# Patient Record
Sex: Female | Born: 1985
Health system: Southern US, Community
[De-identification: ages and names within clinical notes are randomized; demographics above are authoritative.]

## PROBLEM LIST (undated history)

## (undated) DIAGNOSIS — R112 Nausea with vomiting, unspecified: Secondary | ICD-10-CM

## (undated) DIAGNOSIS — Z9889 Other specified postprocedural states: Secondary | ICD-10-CM

## (undated) DIAGNOSIS — C4491 Basal cell carcinoma of skin, unspecified: Secondary | ICD-10-CM

## (undated) DIAGNOSIS — T7840XA Allergy, unspecified, initial encounter: Secondary | ICD-10-CM

## (undated) DIAGNOSIS — E079 Disorder of thyroid, unspecified: Secondary | ICD-10-CM

## (undated) HISTORY — DX: Disorder of thyroid, unspecified: E07.9

## (undated) HISTORY — DX: Allergy, unspecified, initial encounter: T78.40XA

---

## 2013-03-31 DIAGNOSIS — C439 Malignant melanoma of skin, unspecified: Secondary | ICD-10-CM

## 2013-03-31 HISTORY — DX: Malignant melanoma of skin, unspecified: C43.9

## 2013-03-31 HISTORY — PX: THYROIDECTOMY, PARTIAL: SHX18

## 2018-06-21 DIAGNOSIS — J309 Allergic rhinitis, unspecified: Secondary | ICD-10-CM | POA: Diagnosis not present

## 2018-07-12 DIAGNOSIS — J309 Allergic rhinitis, unspecified: Secondary | ICD-10-CM | POA: Diagnosis not present

## 2018-08-03 DIAGNOSIS — J309 Allergic rhinitis, unspecified: Secondary | ICD-10-CM | POA: Diagnosis not present

## 2018-08-24 DIAGNOSIS — J3081 Allergic rhinitis due to animal (cat) (dog) hair and dander: Secondary | ICD-10-CM | POA: Diagnosis not present

## 2018-08-24 DIAGNOSIS — H1045 Other chronic allergic conjunctivitis: Secondary | ICD-10-CM | POA: Diagnosis not present

## 2018-08-24 DIAGNOSIS — J301 Allergic rhinitis due to pollen: Secondary | ICD-10-CM | POA: Diagnosis not present

## 2018-08-24 DIAGNOSIS — J3089 Other allergic rhinitis: Secondary | ICD-10-CM | POA: Diagnosis not present

## 2018-08-27 DIAGNOSIS — J309 Allergic rhinitis, unspecified: Secondary | ICD-10-CM | POA: Diagnosis not present

## 2018-09-02 DIAGNOSIS — S61209A Unspecified open wound of unspecified finger without damage to nail, initial encounter: Secondary | ICD-10-CM | POA: Diagnosis not present

## 2018-09-06 DIAGNOSIS — S61219D Laceration without foreign body of unspecified finger without damage to nail, subsequent encounter: Secondary | ICD-10-CM | POA: Diagnosis not present

## 2018-09-06 DIAGNOSIS — T148XXA Other injury of unspecified body region, initial encounter: Secondary | ICD-10-CM | POA: Diagnosis not present

## 2018-09-06 DIAGNOSIS — L089 Local infection of the skin and subcutaneous tissue, unspecified: Secondary | ICD-10-CM | POA: Diagnosis not present

## 2018-09-06 DIAGNOSIS — Z09 Encounter for follow-up examination after completed treatment for conditions other than malignant neoplasm: Secondary | ICD-10-CM | POA: Diagnosis not present

## 2018-09-08 DIAGNOSIS — J301 Allergic rhinitis due to pollen: Secondary | ICD-10-CM | POA: Diagnosis not present

## 2018-09-08 DIAGNOSIS — J3089 Other allergic rhinitis: Secondary | ICD-10-CM | POA: Diagnosis not present

## 2018-09-08 DIAGNOSIS — J3081 Allergic rhinitis due to animal (cat) (dog) hair and dander: Secondary | ICD-10-CM | POA: Diagnosis not present

## 2018-09-22 DIAGNOSIS — J3089 Other allergic rhinitis: Secondary | ICD-10-CM | POA: Diagnosis not present

## 2018-09-22 DIAGNOSIS — J3081 Allergic rhinitis due to animal (cat) (dog) hair and dander: Secondary | ICD-10-CM | POA: Diagnosis not present

## 2018-09-22 DIAGNOSIS — J301 Allergic rhinitis due to pollen: Secondary | ICD-10-CM | POA: Diagnosis not present

## 2018-09-29 DIAGNOSIS — J3089 Other allergic rhinitis: Secondary | ICD-10-CM | POA: Diagnosis not present

## 2018-09-29 DIAGNOSIS — J301 Allergic rhinitis due to pollen: Secondary | ICD-10-CM | POA: Diagnosis not present

## 2018-09-29 DIAGNOSIS — J3081 Allergic rhinitis due to animal (cat) (dog) hair and dander: Secondary | ICD-10-CM | POA: Diagnosis not present

## 2018-10-06 DIAGNOSIS — J3089 Other allergic rhinitis: Secondary | ICD-10-CM | POA: Diagnosis not present

## 2018-10-06 DIAGNOSIS — J301 Allergic rhinitis due to pollen: Secondary | ICD-10-CM | POA: Diagnosis not present

## 2018-10-06 DIAGNOSIS — J3081 Allergic rhinitis due to animal (cat) (dog) hair and dander: Secondary | ICD-10-CM | POA: Diagnosis not present

## 2018-10-07 DIAGNOSIS — Z01419 Encounter for gynecological examination (general) (routine) without abnormal findings: Secondary | ICD-10-CM | POA: Diagnosis not present

## 2018-10-14 DIAGNOSIS — E559 Vitamin D deficiency, unspecified: Secondary | ICD-10-CM | POA: Diagnosis not present

## 2018-10-20 DIAGNOSIS — J3081 Allergic rhinitis due to animal (cat) (dog) hair and dander: Secondary | ICD-10-CM | POA: Diagnosis not present

## 2018-10-20 DIAGNOSIS — J301 Allergic rhinitis due to pollen: Secondary | ICD-10-CM | POA: Diagnosis not present

## 2018-11-03 DIAGNOSIS — J301 Allergic rhinitis due to pollen: Secondary | ICD-10-CM | POA: Diagnosis not present

## 2018-11-03 DIAGNOSIS — J3081 Allergic rhinitis due to animal (cat) (dog) hair and dander: Secondary | ICD-10-CM | POA: Diagnosis not present

## 2018-11-12 DIAGNOSIS — Z20828 Contact with and (suspected) exposure to other viral communicable diseases: Secondary | ICD-10-CM | POA: Diagnosis not present

## 2018-11-17 DIAGNOSIS — J3081 Allergic rhinitis due to animal (cat) (dog) hair and dander: Secondary | ICD-10-CM | POA: Diagnosis not present

## 2018-11-17 DIAGNOSIS — J301 Allergic rhinitis due to pollen: Secondary | ICD-10-CM | POA: Diagnosis not present

## 2018-12-01 DIAGNOSIS — J301 Allergic rhinitis due to pollen: Secondary | ICD-10-CM | POA: Diagnosis not present

## 2018-12-01 DIAGNOSIS — J3081 Allergic rhinitis due to animal (cat) (dog) hair and dander: Secondary | ICD-10-CM | POA: Diagnosis not present

## 2018-12-10 ENCOUNTER — Other Ambulatory Visit: Payer: Self-pay

## 2018-12-10 ENCOUNTER — Ambulatory Visit: Payer: BC Managed Care – PPO | Admitting: Podiatry

## 2018-12-10 VITALS — Temp 98.1°F

## 2018-12-10 DIAGNOSIS — G8929 Other chronic pain: Secondary | ICD-10-CM

## 2018-12-10 DIAGNOSIS — M79675 Pain in left toe(s): Secondary | ICD-10-CM | POA: Diagnosis not present

## 2018-12-10 DIAGNOSIS — M7752 Other enthesopathy of left foot: Secondary | ICD-10-CM

## 2018-12-10 DIAGNOSIS — Q828 Other specified congenital malformations of skin: Secondary | ICD-10-CM

## 2018-12-16 DIAGNOSIS — J301 Allergic rhinitis due to pollen: Secondary | ICD-10-CM | POA: Diagnosis not present

## 2018-12-16 DIAGNOSIS — J3081 Allergic rhinitis due to animal (cat) (dog) hair and dander: Secondary | ICD-10-CM | POA: Diagnosis not present

## 2018-12-29 DIAGNOSIS — J3089 Other allergic rhinitis: Secondary | ICD-10-CM | POA: Diagnosis not present

## 2018-12-29 DIAGNOSIS — J301 Allergic rhinitis due to pollen: Secondary | ICD-10-CM | POA: Diagnosis not present

## 2018-12-29 DIAGNOSIS — J3081 Allergic rhinitis due to animal (cat) (dog) hair and dander: Secondary | ICD-10-CM | POA: Diagnosis not present

## 2018-12-29 NOTE — Progress Notes (Signed)
  Subjective:  Patient ID: Heather Peters, female    DOB: Apr 28, 1985,  MRN: [id number]  Chief Complaint  Patient presents with  . Foot Problem    Pt states left plantar sub 5th lesion, 1 year duration, no known injuries, denies any drainage. Pt states the pain is on and off, and "feels like a thorn".    33 y.o. female presents with the above complaint.  History above confirmed with patient   Review of Systems: Negative except as noted in the HPI. Denies N/V/F/Ch.  No past medical history on file.  Current Outpatient Medications:  .  etonogestrel-ethinyl estradiol (NUVARING) 0.12-0.015 MG/24HR vaginal ring, , Disp: , Rfl:  .  INCASSIA 0.35 MG tablet, , Disp: , Rfl:  .  NUVARING 0.12-0.015 MG/24HR vaginal ring, , Disp: , Rfl:   Social History   Tobacco Use  Smoking Status Not on file    Not on File Objective:   Vitals:   12/10/18 0942  Temp: 98.1 F (36.7 C)   There is no height or weight on file to calculate BMI. Constitutional Well developed. Well nourished.  Vascular Dorsalis pedis pulses palpable bilaterally. Posterior tibial pulses palpable bilaterally. Capillary refill normal to all digits.  No cyanosis or clubbing noted. Pedal hair growth normal.  Neurologic Normal speech. Oriented to person, place, and time. Epicritic sensation to light touch grossly present bilaterally.  Dermatologic Nails normal Skin punctate keratosis sub-met 5 left  Orthopedic: Normal joint ROM without pain or crepitus bilaterally. No visible deformities. Pain to palpation about the left fifth MPJ   Radiographs: None Assessment:   1. Chronic toe pain, left foot   2. Capsulitis of metatarsophalangeal (MTP) joint of left foot   3. Porokeratosis    Plan:  Patient was evaluated and treated and all questions answered.  Capsulitis left fifth MPJ -Lesion courtesy debrided with 312 blade without incident -Injection delivered to the left fifth MPJ  Procedure: Joint Injection Location:  Left 5th MP joint Skin Prep: Alcohol. Injectate: 0.5 cc 1% lidocaine plain, 0.5 cc dexamethasone phosphate. Disposition: Patient tolerated procedure well. Injection site dressed with a band-aid.    Return in about 3 weeks (around 12/31/2018) for Capsulitis, Left.

## 2018-12-31 ENCOUNTER — Ambulatory Visit: Payer: BC Managed Care – PPO | Admitting: Podiatry

## 2018-12-31 ENCOUNTER — Other Ambulatory Visit: Payer: Self-pay

## 2018-12-31 DIAGNOSIS — M79675 Pain in left toe(s): Secondary | ICD-10-CM | POA: Diagnosis not present

## 2018-12-31 DIAGNOSIS — G8929 Other chronic pain: Secondary | ICD-10-CM | POA: Diagnosis not present

## 2018-12-31 DIAGNOSIS — M7752 Other enthesopathy of left foot: Secondary | ICD-10-CM

## 2018-12-31 NOTE — Progress Notes (Signed)
  Subjective:  Patient ID: Heather Peters, female    DOB: 11-16-1985,  MRN: [id number]  Chief Complaint  Patient presents with  . Foot Pain    f/u on capsulitis to the left, pt states that she is doing a lot better since the last time she was here, pt states that she is no longer feeling any pain when it comes to the injection    33 y.o. female presents with the above complaint.  History above confirmed with patient.  Review of Systems: Negative except as noted in the HPI. Denies N/V/F/Ch.  No past medical history on file.  Current Outpatient Medications:  .  INCASSIA 0.35 MG tablet, , Disp: , Rfl:  .  etonogestrel-ethinyl estradiol (NUVARING) 0.12-0.015 MG/24HR vaginal ring, , Disp: , Rfl:  .  NUVARING 0.12-0.015 MG/24HR vaginal ring, , Disp: , Rfl:   Social History   Tobacco Use  Smoking Status Not on file    No Known Allergies Objective:   There were no vitals filed for this visit. There is no height or weight on file to calculate BMI. Constitutional Well developed. Well nourished.  Vascular Dorsalis pedis pulses palpable bilaterally. Posterior tibial pulses palpable bilaterally. Capillary refill normal to all digits.  No cyanosis or clubbing noted. Pedal hair growth normal.  Neurologic Normal speech. Oriented to person, place, and time. Epicritic sensation to light touch grossly present bilaterally.  Dermatologic Nails normal Skin punctate keratosis sub-met 5 left  Orthopedic: Normal joint ROM without pain or crepitus bilaterally. No visible deformities. No pain to palpation about the left fifth MPJ   Radiographs: None Assessment:   1. Capsulitis of metatarsophalangeal (MTP) joint of left foot   2. Chronic toe pain, left foot    Plan:  Patient was evaluated and treated and all questions answered.  Capsulitis left fifth MPJ -Improved, no pain. -No injection or callus debridement today. -F/u PRN should pain recur -Discussed use of urea cream for callus.  Revitaderm dispensed.    No follow-ups on file.

## 2019-01-12 DIAGNOSIS — J301 Allergic rhinitis due to pollen: Secondary | ICD-10-CM | POA: Diagnosis not present

## 2019-01-12 DIAGNOSIS — J3081 Allergic rhinitis due to animal (cat) (dog) hair and dander: Secondary | ICD-10-CM | POA: Diagnosis not present

## 2019-01-17 DIAGNOSIS — J301 Allergic rhinitis due to pollen: Secondary | ICD-10-CM | POA: Diagnosis not present

## 2019-01-26 DIAGNOSIS — J301 Allergic rhinitis due to pollen: Secondary | ICD-10-CM | POA: Diagnosis not present

## 2019-01-26 DIAGNOSIS — J3081 Allergic rhinitis due to animal (cat) (dog) hair and dander: Secondary | ICD-10-CM | POA: Diagnosis not present

## 2019-01-26 DIAGNOSIS — J3089 Other allergic rhinitis: Secondary | ICD-10-CM | POA: Diagnosis not present

## 2019-02-08 DIAGNOSIS — J3081 Allergic rhinitis due to animal (cat) (dog) hair and dander: Secondary | ICD-10-CM | POA: Diagnosis not present

## 2019-02-08 DIAGNOSIS — J3089 Other allergic rhinitis: Secondary | ICD-10-CM | POA: Diagnosis not present

## 2019-02-08 DIAGNOSIS — J301 Allergic rhinitis due to pollen: Secondary | ICD-10-CM | POA: Diagnosis not present

## 2019-02-15 DIAGNOSIS — J3089 Other allergic rhinitis: Secondary | ICD-10-CM | POA: Diagnosis not present

## 2019-02-15 DIAGNOSIS — J3081 Allergic rhinitis due to animal (cat) (dog) hair and dander: Secondary | ICD-10-CM | POA: Diagnosis not present

## 2019-02-15 DIAGNOSIS — J301 Allergic rhinitis due to pollen: Secondary | ICD-10-CM | POA: Diagnosis not present

## 2019-03-11 DIAGNOSIS — J3081 Allergic rhinitis due to animal (cat) (dog) hair and dander: Secondary | ICD-10-CM | POA: Diagnosis not present

## 2019-03-11 DIAGNOSIS — H1045 Other chronic allergic conjunctivitis: Secondary | ICD-10-CM | POA: Diagnosis not present

## 2019-03-11 DIAGNOSIS — J301 Allergic rhinitis due to pollen: Secondary | ICD-10-CM | POA: Diagnosis not present

## 2019-03-11 DIAGNOSIS — J3089 Other allergic rhinitis: Secondary | ICD-10-CM | POA: Diagnosis not present

## 2019-03-15 DIAGNOSIS — D2271 Melanocytic nevi of right lower limb, including hip: Secondary | ICD-10-CM | POA: Diagnosis not present

## 2019-03-15 DIAGNOSIS — D1801 Hemangioma of skin and subcutaneous tissue: Secondary | ICD-10-CM | POA: Diagnosis not present

## 2019-03-15 DIAGNOSIS — D225 Melanocytic nevi of trunk: Secondary | ICD-10-CM | POA: Diagnosis not present

## 2019-03-15 DIAGNOSIS — Z85820 Personal history of malignant melanoma of skin: Secondary | ICD-10-CM | POA: Diagnosis not present

## 2019-03-15 DIAGNOSIS — D485 Neoplasm of uncertain behavior of skin: Secondary | ICD-10-CM | POA: Diagnosis not present

## 2019-03-15 DIAGNOSIS — L989 Disorder of the skin and subcutaneous tissue, unspecified: Secondary | ICD-10-CM | POA: Diagnosis not present

## 2019-03-22 DIAGNOSIS — J301 Allergic rhinitis due to pollen: Secondary | ICD-10-CM | POA: Diagnosis not present

## 2019-03-22 DIAGNOSIS — J3081 Allergic rhinitis due to animal (cat) (dog) hair and dander: Secondary | ICD-10-CM | POA: Diagnosis not present

## 2019-03-23 DIAGNOSIS — J3089 Other allergic rhinitis: Secondary | ICD-10-CM | POA: Diagnosis not present

## 2019-03-29 DIAGNOSIS — J3089 Other allergic rhinitis: Secondary | ICD-10-CM | POA: Diagnosis not present

## 2019-03-29 DIAGNOSIS — J301 Allergic rhinitis due to pollen: Secondary | ICD-10-CM | POA: Diagnosis not present

## 2019-03-29 DIAGNOSIS — J3081 Allergic rhinitis due to animal (cat) (dog) hair and dander: Secondary | ICD-10-CM | POA: Diagnosis not present

## 2019-03-31 DIAGNOSIS — J3089 Other allergic rhinitis: Secondary | ICD-10-CM | POA: Diagnosis not present

## 2019-03-31 DIAGNOSIS — J3081 Allergic rhinitis due to animal (cat) (dog) hair and dander: Secondary | ICD-10-CM | POA: Diagnosis not present

## 2019-03-31 DIAGNOSIS — J301 Allergic rhinitis due to pollen: Secondary | ICD-10-CM | POA: Diagnosis not present

## 2019-04-06 DIAGNOSIS — J3081 Allergic rhinitis due to animal (cat) (dog) hair and dander: Secondary | ICD-10-CM | POA: Diagnosis not present

## 2019-04-06 DIAGNOSIS — J301 Allergic rhinitis due to pollen: Secondary | ICD-10-CM | POA: Diagnosis not present

## 2019-04-06 DIAGNOSIS — J3089 Other allergic rhinitis: Secondary | ICD-10-CM | POA: Diagnosis not present

## 2019-04-08 DIAGNOSIS — J301 Allergic rhinitis due to pollen: Secondary | ICD-10-CM | POA: Diagnosis not present

## 2019-04-08 DIAGNOSIS — J3089 Other allergic rhinitis: Secondary | ICD-10-CM | POA: Diagnosis not present

## 2019-04-08 DIAGNOSIS — J3081 Allergic rhinitis due to animal (cat) (dog) hair and dander: Secondary | ICD-10-CM | POA: Diagnosis not present

## 2019-04-12 DIAGNOSIS — J3089 Other allergic rhinitis: Secondary | ICD-10-CM | POA: Diagnosis not present

## 2019-04-12 DIAGNOSIS — J3081 Allergic rhinitis due to animal (cat) (dog) hair and dander: Secondary | ICD-10-CM | POA: Diagnosis not present

## 2019-04-12 DIAGNOSIS — J301 Allergic rhinitis due to pollen: Secondary | ICD-10-CM | POA: Diagnosis not present

## 2019-04-14 DIAGNOSIS — J3089 Other allergic rhinitis: Secondary | ICD-10-CM | POA: Diagnosis not present

## 2019-04-14 DIAGNOSIS — J301 Allergic rhinitis due to pollen: Secondary | ICD-10-CM | POA: Diagnosis not present

## 2019-04-14 DIAGNOSIS — J3081 Allergic rhinitis due to animal (cat) (dog) hair and dander: Secondary | ICD-10-CM | POA: Diagnosis not present

## 2019-04-15 DIAGNOSIS — E559 Vitamin D deficiency, unspecified: Secondary | ICD-10-CM | POA: Diagnosis not present

## 2019-04-15 DIAGNOSIS — E890 Postprocedural hypothyroidism: Secondary | ICD-10-CM | POA: Diagnosis not present

## 2019-04-19 DIAGNOSIS — J301 Allergic rhinitis due to pollen: Secondary | ICD-10-CM | POA: Diagnosis not present

## 2019-04-19 DIAGNOSIS — J3089 Other allergic rhinitis: Secondary | ICD-10-CM | POA: Diagnosis not present

## 2019-04-19 DIAGNOSIS — J3081 Allergic rhinitis due to animal (cat) (dog) hair and dander: Secondary | ICD-10-CM | POA: Diagnosis not present

## 2019-04-21 DIAGNOSIS — J301 Allergic rhinitis due to pollen: Secondary | ICD-10-CM | POA: Diagnosis not present

## 2019-04-21 DIAGNOSIS — J3081 Allergic rhinitis due to animal (cat) (dog) hair and dander: Secondary | ICD-10-CM | POA: Diagnosis not present

## 2019-04-21 DIAGNOSIS — J3089 Other allergic rhinitis: Secondary | ICD-10-CM | POA: Diagnosis not present

## 2019-04-25 DIAGNOSIS — J3089 Other allergic rhinitis: Secondary | ICD-10-CM | POA: Diagnosis not present

## 2019-04-25 DIAGNOSIS — J3081 Allergic rhinitis due to animal (cat) (dog) hair and dander: Secondary | ICD-10-CM | POA: Diagnosis not present

## 2019-04-25 DIAGNOSIS — J301 Allergic rhinitis due to pollen: Secondary | ICD-10-CM | POA: Diagnosis not present

## 2019-04-27 DIAGNOSIS — J3089 Other allergic rhinitis: Secondary | ICD-10-CM | POA: Diagnosis not present

## 2019-04-27 DIAGNOSIS — J3081 Allergic rhinitis due to animal (cat) (dog) hair and dander: Secondary | ICD-10-CM | POA: Diagnosis not present

## 2019-04-27 DIAGNOSIS — J301 Allergic rhinitis due to pollen: Secondary | ICD-10-CM | POA: Diagnosis not present

## 2019-05-02 DIAGNOSIS — J3081 Allergic rhinitis due to animal (cat) (dog) hair and dander: Secondary | ICD-10-CM | POA: Diagnosis not present

## 2019-05-02 DIAGNOSIS — J3089 Other allergic rhinitis: Secondary | ICD-10-CM | POA: Diagnosis not present

## 2019-05-02 DIAGNOSIS — J301 Allergic rhinitis due to pollen: Secondary | ICD-10-CM | POA: Diagnosis not present

## 2019-05-06 DIAGNOSIS — J301 Allergic rhinitis due to pollen: Secondary | ICD-10-CM | POA: Diagnosis not present

## 2019-05-06 DIAGNOSIS — J3081 Allergic rhinitis due to animal (cat) (dog) hair and dander: Secondary | ICD-10-CM | POA: Diagnosis not present

## 2019-05-06 DIAGNOSIS — J3089 Other allergic rhinitis: Secondary | ICD-10-CM | POA: Diagnosis not present

## 2019-05-11 DIAGNOSIS — J301 Allergic rhinitis due to pollen: Secondary | ICD-10-CM | POA: Diagnosis not present

## 2019-05-11 DIAGNOSIS — J3081 Allergic rhinitis due to animal (cat) (dog) hair and dander: Secondary | ICD-10-CM | POA: Diagnosis not present

## 2019-05-11 DIAGNOSIS — J3089 Other allergic rhinitis: Secondary | ICD-10-CM | POA: Diagnosis not present

## 2019-05-13 DIAGNOSIS — J3089 Other allergic rhinitis: Secondary | ICD-10-CM | POA: Diagnosis not present

## 2019-05-13 DIAGNOSIS — J3081 Allergic rhinitis due to animal (cat) (dog) hair and dander: Secondary | ICD-10-CM | POA: Diagnosis not present

## 2019-05-13 DIAGNOSIS — J301 Allergic rhinitis due to pollen: Secondary | ICD-10-CM | POA: Diagnosis not present

## 2019-05-18 DIAGNOSIS — J3081 Allergic rhinitis due to animal (cat) (dog) hair and dander: Secondary | ICD-10-CM | POA: Diagnosis not present

## 2019-05-18 DIAGNOSIS — J301 Allergic rhinitis due to pollen: Secondary | ICD-10-CM | POA: Diagnosis not present

## 2019-05-18 DIAGNOSIS — J3089 Other allergic rhinitis: Secondary | ICD-10-CM | POA: Diagnosis not present

## 2019-05-20 DIAGNOSIS — J3081 Allergic rhinitis due to animal (cat) (dog) hair and dander: Secondary | ICD-10-CM | POA: Diagnosis not present

## 2019-05-20 DIAGNOSIS — J3089 Other allergic rhinitis: Secondary | ICD-10-CM | POA: Diagnosis not present

## 2019-05-20 DIAGNOSIS — J301 Allergic rhinitis due to pollen: Secondary | ICD-10-CM | POA: Diagnosis not present

## 2019-05-24 DIAGNOSIS — J3081 Allergic rhinitis due to animal (cat) (dog) hair and dander: Secondary | ICD-10-CM | POA: Diagnosis not present

## 2019-05-24 DIAGNOSIS — J301 Allergic rhinitis due to pollen: Secondary | ICD-10-CM | POA: Diagnosis not present

## 2019-05-24 DIAGNOSIS — J3089 Other allergic rhinitis: Secondary | ICD-10-CM | POA: Diagnosis not present

## 2019-05-27 DIAGNOSIS — J3089 Other allergic rhinitis: Secondary | ICD-10-CM | POA: Diagnosis not present

## 2019-05-27 DIAGNOSIS — J3081 Allergic rhinitis due to animal (cat) (dog) hair and dander: Secondary | ICD-10-CM | POA: Diagnosis not present

## 2019-05-27 DIAGNOSIS — J301 Allergic rhinitis due to pollen: Secondary | ICD-10-CM | POA: Diagnosis not present

## 2019-05-30 DIAGNOSIS — J301 Allergic rhinitis due to pollen: Secondary | ICD-10-CM | POA: Diagnosis not present

## 2019-05-30 DIAGNOSIS — J3081 Allergic rhinitis due to animal (cat) (dog) hair and dander: Secondary | ICD-10-CM | POA: Diagnosis not present

## 2019-05-30 DIAGNOSIS — D485 Neoplasm of uncertain behavior of skin: Secondary | ICD-10-CM | POA: Diagnosis not present

## 2019-05-30 DIAGNOSIS — J3089 Other allergic rhinitis: Secondary | ICD-10-CM | POA: Diagnosis not present

## 2019-05-30 DIAGNOSIS — L905 Scar conditions and fibrosis of skin: Secondary | ICD-10-CM | POA: Diagnosis not present

## 2019-05-30 DIAGNOSIS — Z85820 Personal history of malignant melanoma of skin: Secondary | ICD-10-CM | POA: Diagnosis not present

## 2019-05-30 DIAGNOSIS — L814 Other melanin hyperpigmentation: Secondary | ICD-10-CM | POA: Diagnosis not present

## 2019-05-30 DIAGNOSIS — D225 Melanocytic nevi of trunk: Secondary | ICD-10-CM | POA: Diagnosis not present

## 2019-06-02 DIAGNOSIS — J3089 Other allergic rhinitis: Secondary | ICD-10-CM | POA: Diagnosis not present

## 2019-06-02 DIAGNOSIS — J301 Allergic rhinitis due to pollen: Secondary | ICD-10-CM | POA: Diagnosis not present

## 2019-06-02 DIAGNOSIS — J3081 Allergic rhinitis due to animal (cat) (dog) hair and dander: Secondary | ICD-10-CM | POA: Diagnosis not present

## 2019-06-07 DIAGNOSIS — L255 Unspecified contact dermatitis due to plants, except food: Secondary | ICD-10-CM | POA: Diagnosis not present

## 2019-06-08 DIAGNOSIS — J3089 Other allergic rhinitis: Secondary | ICD-10-CM | POA: Diagnosis not present

## 2019-06-08 DIAGNOSIS — J301 Allergic rhinitis due to pollen: Secondary | ICD-10-CM | POA: Diagnosis not present

## 2019-06-08 DIAGNOSIS — J3081 Allergic rhinitis due to animal (cat) (dog) hair and dander: Secondary | ICD-10-CM | POA: Diagnosis not present

## 2019-06-20 DIAGNOSIS — R21 Rash and other nonspecific skin eruption: Secondary | ICD-10-CM | POA: Diagnosis not present

## 2019-06-22 DIAGNOSIS — H1045 Other chronic allergic conjunctivitis: Secondary | ICD-10-CM | POA: Diagnosis not present

## 2019-06-22 DIAGNOSIS — Z03818 Encounter for observation for suspected exposure to other biological agents ruled out: Secondary | ICD-10-CM | POA: Diagnosis not present

## 2019-06-22 DIAGNOSIS — J3081 Allergic rhinitis due to animal (cat) (dog) hair and dander: Secondary | ICD-10-CM | POA: Diagnosis not present

## 2019-06-22 DIAGNOSIS — J301 Allergic rhinitis due to pollen: Secondary | ICD-10-CM | POA: Diagnosis not present

## 2019-06-22 DIAGNOSIS — J3089 Other allergic rhinitis: Secondary | ICD-10-CM | POA: Diagnosis not present

## 2019-06-22 DIAGNOSIS — Z20828 Contact with and (suspected) exposure to other viral communicable diseases: Secondary | ICD-10-CM | POA: Diagnosis not present

## 2019-06-27 DIAGNOSIS — J3081 Allergic rhinitis due to animal (cat) (dog) hair and dander: Secondary | ICD-10-CM | POA: Diagnosis not present

## 2019-06-27 DIAGNOSIS — J301 Allergic rhinitis due to pollen: Secondary | ICD-10-CM | POA: Diagnosis not present

## 2019-06-27 DIAGNOSIS — J3089 Other allergic rhinitis: Secondary | ICD-10-CM | POA: Diagnosis not present

## 2019-06-29 DIAGNOSIS — Z872 Personal history of diseases of the skin and subcutaneous tissue: Secondary | ICD-10-CM | POA: Diagnosis not present

## 2019-06-29 DIAGNOSIS — Z85820 Personal history of malignant melanoma of skin: Secondary | ICD-10-CM | POA: Diagnosis not present

## 2019-06-29 DIAGNOSIS — L905 Scar conditions and fibrosis of skin: Secondary | ICD-10-CM | POA: Diagnosis not present

## 2019-06-29 DIAGNOSIS — L439 Lichen planus, unspecified: Secondary | ICD-10-CM | POA: Diagnosis not present

## 2019-06-29 DIAGNOSIS — D0372 Melanoma in situ of left lower limb, including hip: Secondary | ICD-10-CM | POA: Diagnosis not present

## 2019-06-29 DIAGNOSIS — D485 Neoplasm of uncertain behavior of skin: Secondary | ICD-10-CM | POA: Diagnosis not present

## 2019-07-04 DIAGNOSIS — J3081 Allergic rhinitis due to animal (cat) (dog) hair and dander: Secondary | ICD-10-CM | POA: Diagnosis not present

## 2019-07-04 DIAGNOSIS — J301 Allergic rhinitis due to pollen: Secondary | ICD-10-CM | POA: Diagnosis not present

## 2019-07-04 DIAGNOSIS — J3089 Other allergic rhinitis: Secondary | ICD-10-CM | POA: Diagnosis not present

## 2019-07-12 DIAGNOSIS — J301 Allergic rhinitis due to pollen: Secondary | ICD-10-CM | POA: Diagnosis not present

## 2019-07-12 DIAGNOSIS — J3089 Other allergic rhinitis: Secondary | ICD-10-CM | POA: Diagnosis not present

## 2019-07-12 DIAGNOSIS — J3081 Allergic rhinitis due to animal (cat) (dog) hair and dander: Secondary | ICD-10-CM | POA: Diagnosis not present

## 2019-07-14 DIAGNOSIS — J3081 Allergic rhinitis due to animal (cat) (dog) hair and dander: Secondary | ICD-10-CM | POA: Diagnosis not present

## 2019-07-14 DIAGNOSIS — J301 Allergic rhinitis due to pollen: Secondary | ICD-10-CM | POA: Diagnosis not present

## 2019-07-15 DIAGNOSIS — J3089 Other allergic rhinitis: Secondary | ICD-10-CM | POA: Diagnosis not present

## 2019-07-25 DIAGNOSIS — J3089 Other allergic rhinitis: Secondary | ICD-10-CM | POA: Diagnosis not present

## 2019-07-25 DIAGNOSIS — J301 Allergic rhinitis due to pollen: Secondary | ICD-10-CM | POA: Diagnosis not present

## 2019-07-25 DIAGNOSIS — J3081 Allergic rhinitis due to animal (cat) (dog) hair and dander: Secondary | ICD-10-CM | POA: Diagnosis not present

## 2019-08-01 DIAGNOSIS — J3089 Other allergic rhinitis: Secondary | ICD-10-CM | POA: Diagnosis not present

## 2019-08-01 DIAGNOSIS — J301 Allergic rhinitis due to pollen: Secondary | ICD-10-CM | POA: Diagnosis not present

## 2019-08-01 DIAGNOSIS — J3081 Allergic rhinitis due to animal (cat) (dog) hair and dander: Secondary | ICD-10-CM | POA: Diagnosis not present

## 2019-08-08 DIAGNOSIS — M25532 Pain in left wrist: Secondary | ICD-10-CM | POA: Diagnosis not present

## 2019-08-10 DIAGNOSIS — J3089 Other allergic rhinitis: Secondary | ICD-10-CM | POA: Diagnosis not present

## 2019-08-10 DIAGNOSIS — J3081 Allergic rhinitis due to animal (cat) (dog) hair and dander: Secondary | ICD-10-CM | POA: Diagnosis not present

## 2019-08-10 DIAGNOSIS — J301 Allergic rhinitis due to pollen: Secondary | ICD-10-CM | POA: Diagnosis not present

## 2019-08-12 DIAGNOSIS — L905 Scar conditions and fibrosis of skin: Secondary | ICD-10-CM | POA: Diagnosis not present

## 2019-08-12 DIAGNOSIS — D485 Neoplasm of uncertain behavior of skin: Secondary | ICD-10-CM | POA: Diagnosis not present

## 2019-08-15 DIAGNOSIS — J301 Allergic rhinitis due to pollen: Secondary | ICD-10-CM | POA: Diagnosis not present

## 2019-08-15 DIAGNOSIS — J3089 Other allergic rhinitis: Secondary | ICD-10-CM | POA: Diagnosis not present

## 2019-08-15 DIAGNOSIS — J3081 Allergic rhinitis due to animal (cat) (dog) hair and dander: Secondary | ICD-10-CM | POA: Diagnosis not present

## 2019-08-18 DIAGNOSIS — J301 Allergic rhinitis due to pollen: Secondary | ICD-10-CM | POA: Diagnosis not present

## 2019-08-18 DIAGNOSIS — J3081 Allergic rhinitis due to animal (cat) (dog) hair and dander: Secondary | ICD-10-CM | POA: Diagnosis not present

## 2019-08-18 DIAGNOSIS — J3089 Other allergic rhinitis: Secondary | ICD-10-CM | POA: Diagnosis not present

## 2019-08-22 DIAGNOSIS — D485 Neoplasm of uncertain behavior of skin: Secondary | ICD-10-CM | POA: Diagnosis not present

## 2019-08-22 DIAGNOSIS — L905 Scar conditions and fibrosis of skin: Secondary | ICD-10-CM | POA: Diagnosis not present

## 2019-08-22 DIAGNOSIS — D225 Melanocytic nevi of trunk: Secondary | ICD-10-CM | POA: Diagnosis not present

## 2019-08-22 DIAGNOSIS — D2271 Melanocytic nevi of right lower limb, including hip: Secondary | ICD-10-CM | POA: Diagnosis not present

## 2019-08-25 DIAGNOSIS — J301 Allergic rhinitis due to pollen: Secondary | ICD-10-CM | POA: Diagnosis not present

## 2019-08-25 DIAGNOSIS — J3081 Allergic rhinitis due to animal (cat) (dog) hair and dander: Secondary | ICD-10-CM | POA: Diagnosis not present

## 2019-08-25 DIAGNOSIS — J3089 Other allergic rhinitis: Secondary | ICD-10-CM | POA: Diagnosis not present

## 2019-08-30 DIAGNOSIS — J3081 Allergic rhinitis due to animal (cat) (dog) hair and dander: Secondary | ICD-10-CM | POA: Diagnosis not present

## 2019-08-30 DIAGNOSIS — J301 Allergic rhinitis due to pollen: Secondary | ICD-10-CM | POA: Diagnosis not present

## 2019-08-30 DIAGNOSIS — J3089 Other allergic rhinitis: Secondary | ICD-10-CM | POA: Diagnosis not present

## 2019-09-02 DIAGNOSIS — J301 Allergic rhinitis due to pollen: Secondary | ICD-10-CM | POA: Diagnosis not present

## 2019-09-02 DIAGNOSIS — J3081 Allergic rhinitis due to animal (cat) (dog) hair and dander: Secondary | ICD-10-CM | POA: Diagnosis not present

## 2019-09-02 DIAGNOSIS — J3089 Other allergic rhinitis: Secondary | ICD-10-CM | POA: Diagnosis not present

## 2019-09-08 DIAGNOSIS — J3081 Allergic rhinitis due to animal (cat) (dog) hair and dander: Secondary | ICD-10-CM | POA: Diagnosis not present

## 2019-09-08 DIAGNOSIS — J301 Allergic rhinitis due to pollen: Secondary | ICD-10-CM | POA: Diagnosis not present

## 2019-09-08 DIAGNOSIS — J3089 Other allergic rhinitis: Secondary | ICD-10-CM | POA: Diagnosis not present

## 2019-09-19 DIAGNOSIS — J3089 Other allergic rhinitis: Secondary | ICD-10-CM | POA: Diagnosis not present

## 2019-09-19 DIAGNOSIS — J301 Allergic rhinitis due to pollen: Secondary | ICD-10-CM | POA: Diagnosis not present

## 2019-09-19 DIAGNOSIS — J3081 Allergic rhinitis due to animal (cat) (dog) hair and dander: Secondary | ICD-10-CM | POA: Diagnosis not present

## 2019-09-26 DIAGNOSIS — J3081 Allergic rhinitis due to animal (cat) (dog) hair and dander: Secondary | ICD-10-CM | POA: Diagnosis not present

## 2019-09-26 DIAGNOSIS — J3089 Other allergic rhinitis: Secondary | ICD-10-CM | POA: Diagnosis not present

## 2019-09-26 DIAGNOSIS — J301 Allergic rhinitis due to pollen: Secondary | ICD-10-CM | POA: Diagnosis not present

## 2019-10-04 DIAGNOSIS — J301 Allergic rhinitis due to pollen: Secondary | ICD-10-CM | POA: Diagnosis not present

## 2019-10-04 DIAGNOSIS — J3089 Other allergic rhinitis: Secondary | ICD-10-CM | POA: Diagnosis not present

## 2019-10-04 DIAGNOSIS — J3081 Allergic rhinitis due to animal (cat) (dog) hair and dander: Secondary | ICD-10-CM | POA: Diagnosis not present

## 2019-10-11 DIAGNOSIS — J3089 Other allergic rhinitis: Secondary | ICD-10-CM | POA: Diagnosis not present

## 2019-10-11 DIAGNOSIS — D485 Neoplasm of uncertain behavior of skin: Secondary | ICD-10-CM | POA: Diagnosis not present

## 2019-10-11 DIAGNOSIS — J301 Allergic rhinitis due to pollen: Secondary | ICD-10-CM | POA: Diagnosis not present

## 2019-10-11 DIAGNOSIS — J3081 Allergic rhinitis due to animal (cat) (dog) hair and dander: Secondary | ICD-10-CM | POA: Diagnosis not present

## 2019-10-11 DIAGNOSIS — Z872 Personal history of diseases of the skin and subcutaneous tissue: Secondary | ICD-10-CM | POA: Diagnosis not present

## 2019-10-11 DIAGNOSIS — D2271 Melanocytic nevi of right lower limb, including hip: Secondary | ICD-10-CM | POA: Diagnosis not present

## 2019-10-11 DIAGNOSIS — L910 Hypertrophic scar: Secondary | ICD-10-CM | POA: Diagnosis not present

## 2019-10-11 DIAGNOSIS — L905 Scar conditions and fibrosis of skin: Secondary | ICD-10-CM | POA: Diagnosis not present

## 2019-10-18 DIAGNOSIS — J3089 Other allergic rhinitis: Secondary | ICD-10-CM | POA: Diagnosis not present

## 2019-10-18 DIAGNOSIS — J301 Allergic rhinitis due to pollen: Secondary | ICD-10-CM | POA: Diagnosis not present

## 2019-10-18 DIAGNOSIS — J3081 Allergic rhinitis due to animal (cat) (dog) hair and dander: Secondary | ICD-10-CM | POA: Diagnosis not present

## 2019-10-25 DIAGNOSIS — J301 Allergic rhinitis due to pollen: Secondary | ICD-10-CM | POA: Diagnosis not present

## 2019-10-25 DIAGNOSIS — J3089 Other allergic rhinitis: Secondary | ICD-10-CM | POA: Diagnosis not present

## 2019-10-25 DIAGNOSIS — J3081 Allergic rhinitis due to animal (cat) (dog) hair and dander: Secondary | ICD-10-CM | POA: Diagnosis not present

## 2019-11-01 DIAGNOSIS — Z01419 Encounter for gynecological examination (general) (routine) without abnormal findings: Secondary | ICD-10-CM | POA: Diagnosis not present

## 2019-11-02 DIAGNOSIS — J3089 Other allergic rhinitis: Secondary | ICD-10-CM | POA: Diagnosis not present

## 2019-11-02 DIAGNOSIS — J3081 Allergic rhinitis due to animal (cat) (dog) hair and dander: Secondary | ICD-10-CM | POA: Diagnosis not present

## 2019-11-02 DIAGNOSIS — J301 Allergic rhinitis due to pollen: Secondary | ICD-10-CM | POA: Diagnosis not present

## 2019-11-08 DIAGNOSIS — J3089 Other allergic rhinitis: Secondary | ICD-10-CM | POA: Diagnosis not present

## 2019-11-08 DIAGNOSIS — J3081 Allergic rhinitis due to animal (cat) (dog) hair and dander: Secondary | ICD-10-CM | POA: Diagnosis not present

## 2019-11-08 DIAGNOSIS — J301 Allergic rhinitis due to pollen: Secondary | ICD-10-CM | POA: Diagnosis not present

## 2019-11-14 DIAGNOSIS — J3081 Allergic rhinitis due to animal (cat) (dog) hair and dander: Secondary | ICD-10-CM | POA: Diagnosis not present

## 2019-11-14 DIAGNOSIS — J301 Allergic rhinitis due to pollen: Secondary | ICD-10-CM | POA: Diagnosis not present

## 2019-11-14 DIAGNOSIS — J3089 Other allergic rhinitis: Secondary | ICD-10-CM | POA: Diagnosis not present

## 2019-11-21 DIAGNOSIS — J3089 Other allergic rhinitis: Secondary | ICD-10-CM | POA: Diagnosis not present

## 2019-11-21 DIAGNOSIS — J301 Allergic rhinitis due to pollen: Secondary | ICD-10-CM | POA: Diagnosis not present

## 2019-11-21 DIAGNOSIS — J3081 Allergic rhinitis due to animal (cat) (dog) hair and dander: Secondary | ICD-10-CM | POA: Diagnosis not present

## 2019-11-28 DIAGNOSIS — J3081 Allergic rhinitis due to animal (cat) (dog) hair and dander: Secondary | ICD-10-CM | POA: Diagnosis not present

## 2019-11-28 DIAGNOSIS — J301 Allergic rhinitis due to pollen: Secondary | ICD-10-CM | POA: Diagnosis not present

## 2019-11-28 DIAGNOSIS — J3089 Other allergic rhinitis: Secondary | ICD-10-CM | POA: Diagnosis not present

## 2019-12-06 DIAGNOSIS — J3089 Other allergic rhinitis: Secondary | ICD-10-CM | POA: Diagnosis not present

## 2019-12-06 DIAGNOSIS — J301 Allergic rhinitis due to pollen: Secondary | ICD-10-CM | POA: Diagnosis not present

## 2019-12-06 DIAGNOSIS — J3081 Allergic rhinitis due to animal (cat) (dog) hair and dander: Secondary | ICD-10-CM | POA: Diagnosis not present

## 2019-12-07 DIAGNOSIS — Z85820 Personal history of malignant melanoma of skin: Secondary | ICD-10-CM | POA: Diagnosis not present

## 2019-12-07 DIAGNOSIS — L905 Scar conditions and fibrosis of skin: Secondary | ICD-10-CM | POA: Diagnosis not present

## 2019-12-07 DIAGNOSIS — L578 Other skin changes due to chronic exposure to nonionizing radiation: Secondary | ICD-10-CM | POA: Diagnosis not present

## 2019-12-08 DIAGNOSIS — Z20822 Contact with and (suspected) exposure to covid-19: Secondary | ICD-10-CM | POA: Diagnosis not present

## 2019-12-09 DIAGNOSIS — H1045 Other chronic allergic conjunctivitis: Secondary | ICD-10-CM | POA: Diagnosis not present

## 2019-12-09 DIAGNOSIS — J301 Allergic rhinitis due to pollen: Secondary | ICD-10-CM | POA: Diagnosis not present

## 2019-12-09 DIAGNOSIS — J3089 Other allergic rhinitis: Secondary | ICD-10-CM | POA: Diagnosis not present

## 2019-12-09 DIAGNOSIS — J3081 Allergic rhinitis due to animal (cat) (dog) hair and dander: Secondary | ICD-10-CM | POA: Diagnosis not present

## 2019-12-12 DIAGNOSIS — J301 Allergic rhinitis due to pollen: Secondary | ICD-10-CM | POA: Diagnosis not present

## 2019-12-12 DIAGNOSIS — J3081 Allergic rhinitis due to animal (cat) (dog) hair and dander: Secondary | ICD-10-CM | POA: Diagnosis not present

## 2019-12-12 DIAGNOSIS — J3089 Other allergic rhinitis: Secondary | ICD-10-CM | POA: Diagnosis not present

## 2019-12-19 DIAGNOSIS — J301 Allergic rhinitis due to pollen: Secondary | ICD-10-CM | POA: Diagnosis not present

## 2019-12-19 DIAGNOSIS — J3081 Allergic rhinitis due to animal (cat) (dog) hair and dander: Secondary | ICD-10-CM | POA: Diagnosis not present

## 2019-12-19 DIAGNOSIS — J3089 Other allergic rhinitis: Secondary | ICD-10-CM | POA: Diagnosis not present

## 2019-12-27 DIAGNOSIS — J301 Allergic rhinitis due to pollen: Secondary | ICD-10-CM | POA: Diagnosis not present

## 2019-12-27 DIAGNOSIS — J3081 Allergic rhinitis due to animal (cat) (dog) hair and dander: Secondary | ICD-10-CM | POA: Diagnosis not present

## 2019-12-28 DIAGNOSIS — J3081 Allergic rhinitis due to animal (cat) (dog) hair and dander: Secondary | ICD-10-CM | POA: Diagnosis not present

## 2019-12-28 DIAGNOSIS — J301 Allergic rhinitis due to pollen: Secondary | ICD-10-CM | POA: Diagnosis not present

## 2019-12-28 DIAGNOSIS — J3089 Other allergic rhinitis: Secondary | ICD-10-CM | POA: Diagnosis not present

## 2020-01-04 DIAGNOSIS — M5442 Lumbago with sciatica, left side: Secondary | ICD-10-CM | POA: Diagnosis not present

## 2020-01-04 DIAGNOSIS — M9903 Segmental and somatic dysfunction of lumbar region: Secondary | ICD-10-CM | POA: Diagnosis not present

## 2020-01-04 DIAGNOSIS — M9905 Segmental and somatic dysfunction of pelvic region: Secondary | ICD-10-CM | POA: Diagnosis not present

## 2020-01-04 DIAGNOSIS — M9902 Segmental and somatic dysfunction of thoracic region: Secondary | ICD-10-CM | POA: Diagnosis not present

## 2020-01-05 DIAGNOSIS — J3089 Other allergic rhinitis: Secondary | ICD-10-CM | POA: Diagnosis not present

## 2020-01-05 DIAGNOSIS — J3081 Allergic rhinitis due to animal (cat) (dog) hair and dander: Secondary | ICD-10-CM | POA: Diagnosis not present

## 2020-01-05 DIAGNOSIS — J301 Allergic rhinitis due to pollen: Secondary | ICD-10-CM | POA: Diagnosis not present

## 2020-01-10 DIAGNOSIS — M9902 Segmental and somatic dysfunction of thoracic region: Secondary | ICD-10-CM | POA: Diagnosis not present

## 2020-01-10 DIAGNOSIS — J3089 Other allergic rhinitis: Secondary | ICD-10-CM | POA: Diagnosis not present

## 2020-01-10 DIAGNOSIS — M9903 Segmental and somatic dysfunction of lumbar region: Secondary | ICD-10-CM | POA: Diagnosis not present

## 2020-01-10 DIAGNOSIS — J3081 Allergic rhinitis due to animal (cat) (dog) hair and dander: Secondary | ICD-10-CM | POA: Diagnosis not present

## 2020-01-10 DIAGNOSIS — M5442 Lumbago with sciatica, left side: Secondary | ICD-10-CM | POA: Diagnosis not present

## 2020-01-10 DIAGNOSIS — M9905 Segmental and somatic dysfunction of pelvic region: Secondary | ICD-10-CM | POA: Diagnosis not present

## 2020-01-10 DIAGNOSIS — J301 Allergic rhinitis due to pollen: Secondary | ICD-10-CM | POA: Diagnosis not present

## 2020-01-12 DIAGNOSIS — M9903 Segmental and somatic dysfunction of lumbar region: Secondary | ICD-10-CM | POA: Diagnosis not present

## 2020-01-12 DIAGNOSIS — M9905 Segmental and somatic dysfunction of pelvic region: Secondary | ICD-10-CM | POA: Diagnosis not present

## 2020-01-12 DIAGNOSIS — M9902 Segmental and somatic dysfunction of thoracic region: Secondary | ICD-10-CM | POA: Diagnosis not present

## 2020-01-12 DIAGNOSIS — M5442 Lumbago with sciatica, left side: Secondary | ICD-10-CM | POA: Diagnosis not present

## 2020-01-17 DIAGNOSIS — M5442 Lumbago with sciatica, left side: Secondary | ICD-10-CM | POA: Diagnosis not present

## 2020-01-17 DIAGNOSIS — M9903 Segmental and somatic dysfunction of lumbar region: Secondary | ICD-10-CM | POA: Diagnosis not present

## 2020-01-17 DIAGNOSIS — M9902 Segmental and somatic dysfunction of thoracic region: Secondary | ICD-10-CM | POA: Diagnosis not present

## 2020-01-17 DIAGNOSIS — M9905 Segmental and somatic dysfunction of pelvic region: Secondary | ICD-10-CM | POA: Diagnosis not present

## 2020-01-19 DIAGNOSIS — M9905 Segmental and somatic dysfunction of pelvic region: Secondary | ICD-10-CM | POA: Diagnosis not present

## 2020-01-19 DIAGNOSIS — J301 Allergic rhinitis due to pollen: Secondary | ICD-10-CM | POA: Diagnosis not present

## 2020-01-19 DIAGNOSIS — J3081 Allergic rhinitis due to animal (cat) (dog) hair and dander: Secondary | ICD-10-CM | POA: Diagnosis not present

## 2020-01-19 DIAGNOSIS — J3089 Other allergic rhinitis: Secondary | ICD-10-CM | POA: Diagnosis not present

## 2020-01-19 DIAGNOSIS — M9902 Segmental and somatic dysfunction of thoracic region: Secondary | ICD-10-CM | POA: Diagnosis not present

## 2020-01-19 DIAGNOSIS — M9903 Segmental and somatic dysfunction of lumbar region: Secondary | ICD-10-CM | POA: Diagnosis not present

## 2020-01-19 DIAGNOSIS — M5442 Lumbago with sciatica, left side: Secondary | ICD-10-CM | POA: Diagnosis not present

## 2020-01-23 DIAGNOSIS — J3089 Other allergic rhinitis: Secondary | ICD-10-CM | POA: Diagnosis not present

## 2020-01-23 DIAGNOSIS — J3081 Allergic rhinitis due to animal (cat) (dog) hair and dander: Secondary | ICD-10-CM | POA: Diagnosis not present

## 2020-01-23 DIAGNOSIS — J301 Allergic rhinitis due to pollen: Secondary | ICD-10-CM | POA: Diagnosis not present

## 2020-01-23 DIAGNOSIS — M9905 Segmental and somatic dysfunction of pelvic region: Secondary | ICD-10-CM | POA: Diagnosis not present

## 2020-01-23 DIAGNOSIS — M9903 Segmental and somatic dysfunction of lumbar region: Secondary | ICD-10-CM | POA: Diagnosis not present

## 2020-01-23 DIAGNOSIS — M5442 Lumbago with sciatica, left side: Secondary | ICD-10-CM | POA: Diagnosis not present

## 2020-01-23 DIAGNOSIS — M9902 Segmental and somatic dysfunction of thoracic region: Secondary | ICD-10-CM | POA: Diagnosis not present

## 2020-01-25 DIAGNOSIS — M9905 Segmental and somatic dysfunction of pelvic region: Secondary | ICD-10-CM | POA: Diagnosis not present

## 2020-01-25 DIAGNOSIS — M5442 Lumbago with sciatica, left side: Secondary | ICD-10-CM | POA: Diagnosis not present

## 2020-01-25 DIAGNOSIS — M9902 Segmental and somatic dysfunction of thoracic region: Secondary | ICD-10-CM | POA: Diagnosis not present

## 2020-01-25 DIAGNOSIS — M9903 Segmental and somatic dysfunction of lumbar region: Secondary | ICD-10-CM | POA: Diagnosis not present

## 2020-01-31 DIAGNOSIS — J3081 Allergic rhinitis due to animal (cat) (dog) hair and dander: Secondary | ICD-10-CM | POA: Diagnosis not present

## 2020-01-31 DIAGNOSIS — M5442 Lumbago with sciatica, left side: Secondary | ICD-10-CM | POA: Diagnosis not present

## 2020-01-31 DIAGNOSIS — M9902 Segmental and somatic dysfunction of thoracic region: Secondary | ICD-10-CM | POA: Diagnosis not present

## 2020-01-31 DIAGNOSIS — M9903 Segmental and somatic dysfunction of lumbar region: Secondary | ICD-10-CM | POA: Diagnosis not present

## 2020-01-31 DIAGNOSIS — J301 Allergic rhinitis due to pollen: Secondary | ICD-10-CM | POA: Diagnosis not present

## 2020-01-31 DIAGNOSIS — J3089 Other allergic rhinitis: Secondary | ICD-10-CM | POA: Diagnosis not present

## 2020-01-31 DIAGNOSIS — M9905 Segmental and somatic dysfunction of pelvic region: Secondary | ICD-10-CM | POA: Diagnosis not present

## 2020-02-01 ENCOUNTER — Other Ambulatory Visit: Payer: Self-pay

## 2020-02-01 ENCOUNTER — Encounter: Payer: Self-pay | Admitting: Physician Assistant

## 2020-02-01 ENCOUNTER — Ambulatory Visit: Payer: BC Managed Care – PPO | Admitting: Physician Assistant

## 2020-02-01 VITALS — BP 110/70 | HR 74 | Temp 98.4°F | Resp 14 | Ht 67.5 in | Wt 131.0 lb

## 2020-02-01 DIAGNOSIS — R590 Localized enlarged lymph nodes: Secondary | ICD-10-CM | POA: Diagnosis not present

## 2020-02-01 DIAGNOSIS — R61 Generalized hyperhidrosis: Secondary | ICD-10-CM | POA: Diagnosis not present

## 2020-02-01 DIAGNOSIS — R634 Abnormal weight loss: Secondary | ICD-10-CM | POA: Diagnosis not present

## 2020-02-01 LAB — COMPREHENSIVE METABOLIC PANEL
ALT: 23 U/L (ref 0–35)
AST: 36 U/L (ref 0–37)
Albumin: 4.2 g/dL (ref 3.5–5.2)
Alkaline Phosphatase: 64 U/L (ref 39–117)
BUN: 11 mg/dL (ref 6–23)
CO2: 31 mEq/L (ref 19–32)
Calcium: 9.1 mg/dL (ref 8.4–10.5)
Chloride: 101 mEq/L (ref 96–112)
Creatinine, Ser: 0.94 mg/dL (ref 0.40–1.20)
GFR: 78 mL/min (ref >60.00)
Glucose, Bld: 87 mg/dL (ref 70–99)
Potassium: 4.2 mEq/L (ref 3.5–5.1)
Sodium: 136 mEq/L (ref 135–145)
Total Bilirubin: 1 mg/dL (ref 0.2–1.2)
Total Protein: 6.6 g/dL (ref 6.0–8.3)

## 2020-02-01 LAB — CBC WITH DIFFERENTIAL/PLATELET
Basophils Absolute: 0 10*3/uL (ref 0.0–0.1)
Basophils Relative: 0.9 % (ref 0.0–3.0)
Eosinophils Absolute: 0.1 10*3/uL (ref 0.0–0.7)
Eosinophils Relative: 2.7 % (ref 0.0–5.0)
HCT: 40.1 % (ref 36.0–46.0)
Hemoglobin: 13.5 g/dL (ref 12.0–15.0)
Lymphocytes Relative: 30.4 % (ref 12.0–46.0)
Lymphs Abs: 1.2 10*3/uL (ref 0.7–4.0)
MCHC: 33.5 g/dL (ref 30.0–36.0)
MCV: 93.2 fl (ref 78.0–100.0)
Monocytes Absolute: 0.3 10*3/uL (ref 0.1–1.0)
Monocytes Relative: 7 % (ref 3.0–12.0)
Neutro Abs: 2.3 10*3/uL (ref 1.4–7.7)
Neutrophils Relative %: 59 % (ref 43.0–77.0)
Platelets: 150 10*3/uL (ref 150.0–400.0)
RBC: 4.3 Mil/uL (ref 3.87–5.11)
RDW: 14 % (ref 11.5–15.5)
WBC: 3.9 10*3/uL — ABNORMAL LOW (ref 4.0–10.5)

## 2020-02-01 LAB — T4, FREE: Free T4: 0.69 ng/dL (ref 0.60–1.60)

## 2020-02-01 LAB — VITAMIN B12: Vitamin B-12: 194 pg/mL — ABNORMAL LOW (ref 211–911)

## 2020-02-01 LAB — T3, FREE: T3, Free: 3.1 pg/mL (ref 2.3–4.2)

## 2020-02-01 LAB — TSH: TSH: 1.47 u[IU]/mL (ref 0.35–4.50)

## 2020-02-01 LAB — SEDIMENTATION RATE: Sed Rate: 1 mm/hr (ref 0–20)
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
## 2020-02-01 NOTE — Patient Instructions (Signed)
Please go to the lab today for blood work.  I will call you with your results. We will alter treatment regimen(s) if indicated by your results.   We will proceed with imaging [place] versus CT of the neck based on lab findings.   Try to keep hydrated and rest. Turn down the temperature in the bedroom at night to try to help in the meantime while we are assessing things.   You will be contacted by Sports medicine for further evaluation of ongoing knee pain.   It was very nice meeting you today. Welcome to [facility]!

## 2020-02-03 ENCOUNTER — Other Ambulatory Visit: Payer: Self-pay | Admitting: Emergency Medicine

## 2020-02-03 DIAGNOSIS — J3081 Allergic rhinitis due to animal (cat) (dog) hair and dander: Secondary | ICD-10-CM | POA: Diagnosis not present

## 2020-02-03 DIAGNOSIS — J3089 Other allergic rhinitis: Secondary | ICD-10-CM | POA: Diagnosis not present

## 2020-02-03 DIAGNOSIS — D72819 Decreased white blood cell count, unspecified: Secondary | ICD-10-CM

## 2020-02-03 DIAGNOSIS — J301 Allergic rhinitis due to pollen: Secondary | ICD-10-CM | POA: Diagnosis not present

## 2020-02-03 DIAGNOSIS — E538 Deficiency of other specified B group vitamins: Secondary | ICD-10-CM

## 2020-02-03 DIAGNOSIS — R590 Localized enlarged lymph nodes: Secondary | ICD-10-CM

## 2020-02-03 LAB — QUANTIFERON-TB GOLD PLUS
Mitogen-NIL: 9.66 IU/mL
NIL: 0.03 IU/mL
QuantiFERON-TB Gold Plus: NEGATIVE
TB1-NIL: 0 IU/mL
TB2-NIL: 0 IU/mL

## 2020-02-03 MED ORDER — VITAMIN B-12 500 MCG SL SUBL: 1.0000 | SUBLINGUAL_TABLET | Freq: Every day | SUBLINGUAL | 0 refills | Status: AC

## 2020-02-06 ENCOUNTER — Ambulatory Visit (INDEPENDENT_AMBULATORY_CARE_PROVIDER_SITE_OTHER): Payer: BC Managed Care – PPO

## 2020-02-06 ENCOUNTER — Other Ambulatory Visit: Payer: Self-pay | Admitting: Physician Assistant

## 2020-02-06 ENCOUNTER — Other Ambulatory Visit: Payer: Self-pay

## 2020-02-06 DIAGNOSIS — R634 Abnormal weight loss: Secondary | ICD-10-CM

## 2020-02-06 DIAGNOSIS — J301 Allergic rhinitis due to pollen: Secondary | ICD-10-CM | POA: Diagnosis not present

## 2020-02-06 DIAGNOSIS — E538 Deficiency of other specified B group vitamins: Secondary | ICD-10-CM

## 2020-02-06 DIAGNOSIS — J3089 Other allergic rhinitis: Secondary | ICD-10-CM | POA: Diagnosis not present

## 2020-02-06 DIAGNOSIS — J3081 Allergic rhinitis due to animal (cat) (dog) hair and dander: Secondary | ICD-10-CM | POA: Diagnosis not present

## 2020-02-06 DIAGNOSIS — R221 Localized swelling, mass and lump, neck: Secondary | ICD-10-CM

## 2020-02-06 MED ORDER — CYANOCOBALAMIN 1000 MCG/ML IJ SOLN
1000.0000 ug | Freq: Once | INTRAMUSCULAR | Status: AC
  Administered 2020-02-06: 1000 ug via INTRAMUSCULAR

## 2020-02-06 NOTE — Progress Notes (Signed)
[place], 34 y.o. female here for her 1st of 4 weekly CYANOCOBALAMIN 1000 mcg/mL injections per PCP orders. Patient received 48mL cyanocobalamin in the left deltoid. Patient tolerated well and left the office in good condition. Patient scheduled to return next week for 2nd injection. Fritz Pickerel, LPN

## 2020-02-08 DIAGNOSIS — J301 Allergic rhinitis due to pollen: Secondary | ICD-10-CM | POA: Diagnosis not present

## 2020-02-08 DIAGNOSIS — J3081 Allergic rhinitis due to animal (cat) (dog) hair and dander: Secondary | ICD-10-CM | POA: Diagnosis not present

## 2020-02-08 DIAGNOSIS — J3089 Other allergic rhinitis: Secondary | ICD-10-CM | POA: Diagnosis not present

## 2020-02-13 ENCOUNTER — Other Ambulatory Visit: Payer: Self-pay | Admitting: Physician Assistant

## 2020-02-13 ENCOUNTER — Ambulatory Visit (INDEPENDENT_AMBULATORY_CARE_PROVIDER_SITE_OTHER): Payer: BC Managed Care – PPO

## 2020-02-13 ENCOUNTER — Other Ambulatory Visit: Payer: Self-pay

## 2020-02-13 DIAGNOSIS — E538 Deficiency of other specified B group vitamins: Secondary | ICD-10-CM | POA: Diagnosis not present

## 2020-02-13 DIAGNOSIS — R221 Localized swelling, mass and lump, neck: Secondary | ICD-10-CM

## 2020-02-13 MED ORDER — CYANOCOBALAMIN 1000 MCG/ML IJ SOLN
1000.0000 ug | Freq: Once | INTRAMUSCULAR | Status: AC
  Administered 2020-02-13: 1000 ug via INTRAMUSCULAR

## 2020-02-13 NOTE — Progress Notes (Signed)
[place], 34 y.o. female here for her2nd of 4 weekly CYANOCOBALAMIN 1000 mcg/mL injections per PCP orders. Patient received 50mL cyanocobalamin in the left deltoid. Patient tolerated well and left the office in good condition. Patient scheduled to return next week for her 3rd injection. Fritz Pickerel, LPN

## 2020-02-15 ENCOUNTER — Other Ambulatory Visit: Payer: BC Managed Care – PPO

## 2020-02-15 ENCOUNTER — Ambulatory Visit
Admission: RE | Admit: 2020-02-15 | Discharge: 2020-02-15 | Disposition: A | Discharge status: Home / Self Care | Payer: BC Managed Care – PPO | Source: Ambulatory Visit | Attending: Physician Assistant | Admitting: Physician Assistant

## 2020-02-15 DIAGNOSIS — J301 Allergic rhinitis due to pollen: Secondary | ICD-10-CM | POA: Diagnosis not present

## 2020-02-15 DIAGNOSIS — R221 Localized swelling, mass and lump, neck: Secondary | ICD-10-CM | POA: Diagnosis not present

## 2020-02-15 DIAGNOSIS — J3081 Allergic rhinitis due to animal (cat) (dog) hair and dander: Secondary | ICD-10-CM | POA: Diagnosis not present

## 2020-02-15 DIAGNOSIS — J3089 Other allergic rhinitis: Secondary | ICD-10-CM | POA: Diagnosis not present

## 2020-02-15 IMAGING — US US SOFT TISSUE HEAD/NECK
1 series · 13 of 14 positions shown · non-contrast
Comparison: None available.

CLINICAL DATA: Initial evaluation for palpable mass of right neck.

EXAM:
ULTRASOUND OF HEAD/NECK SOFT TISSUES
TECHNIQUE: Ultrasound examination of the head and neck soft tissues was
performed in the area of clinical concern.

[Series 1: us soft tissue head/neck · 0.05mm/px · 13 of 14 slices shown]
[im 1/14]
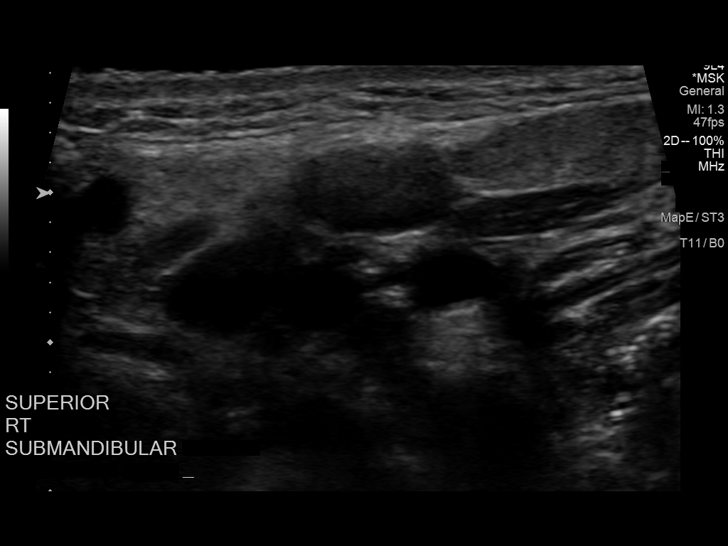
[im 2/14]
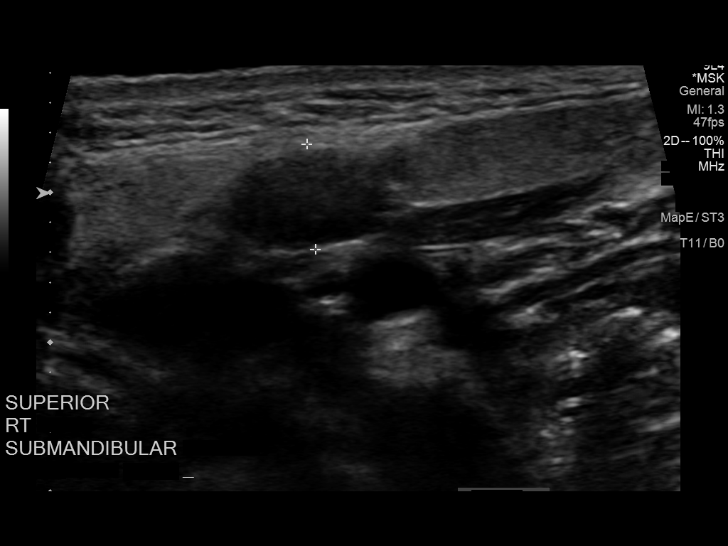
[im 3/14]
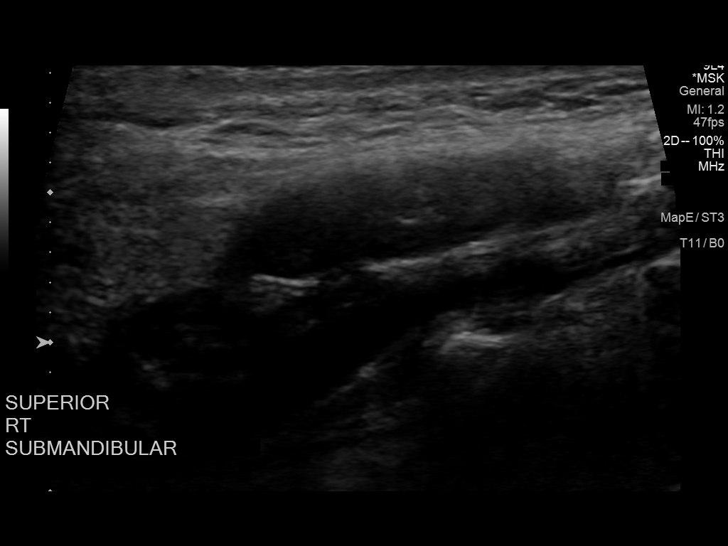
[im 4/14]
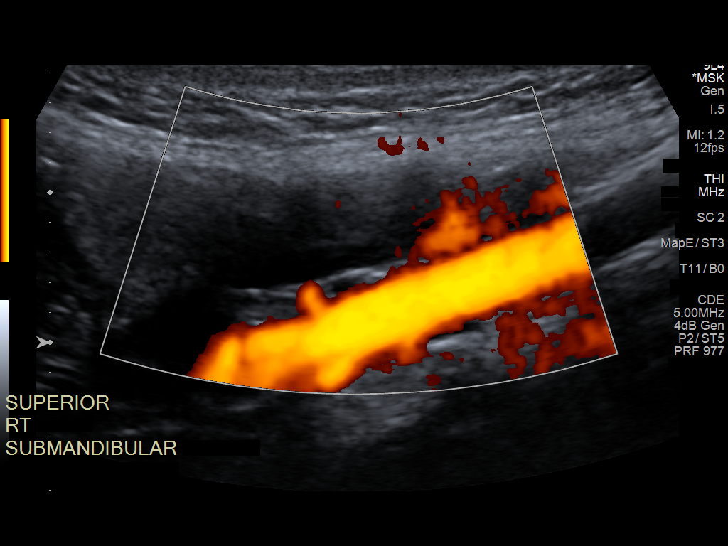
[im 5/14]
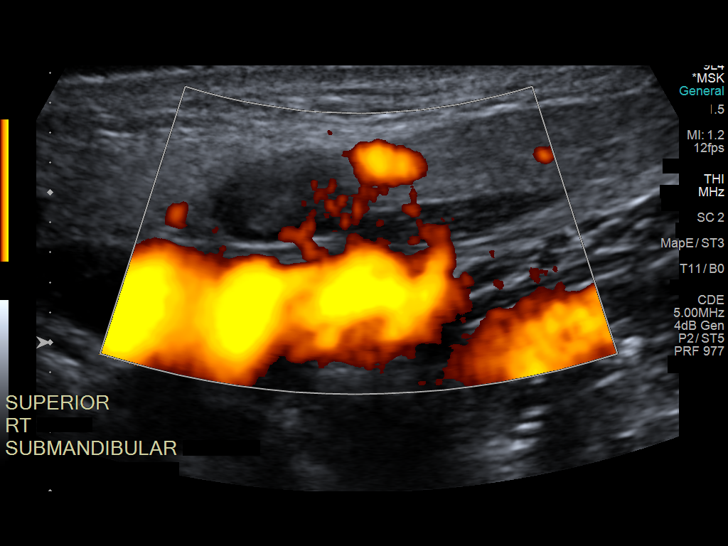
[im 6/14]
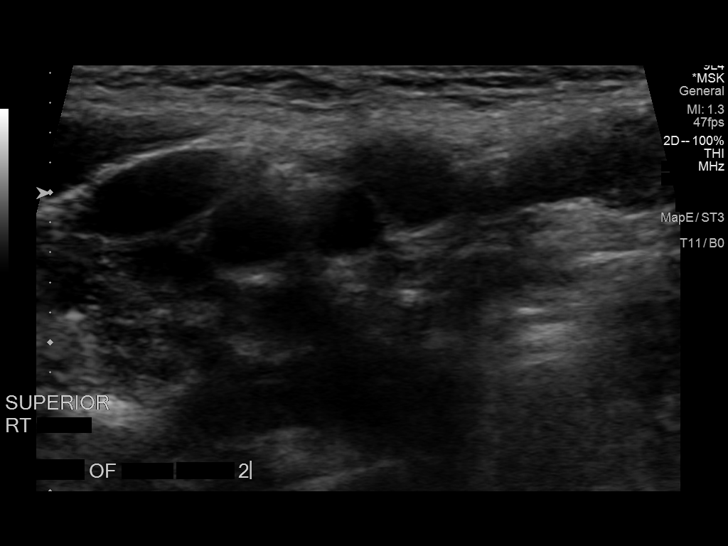
[im 8/14]
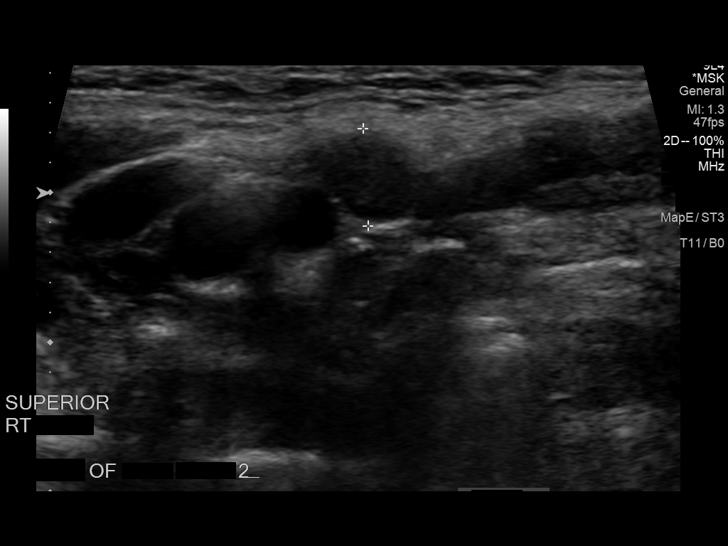
[im 9/14]
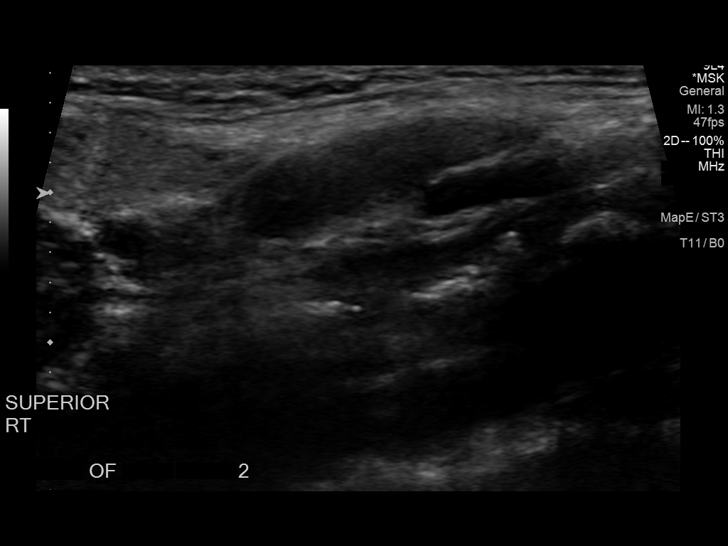
[im 10/14]
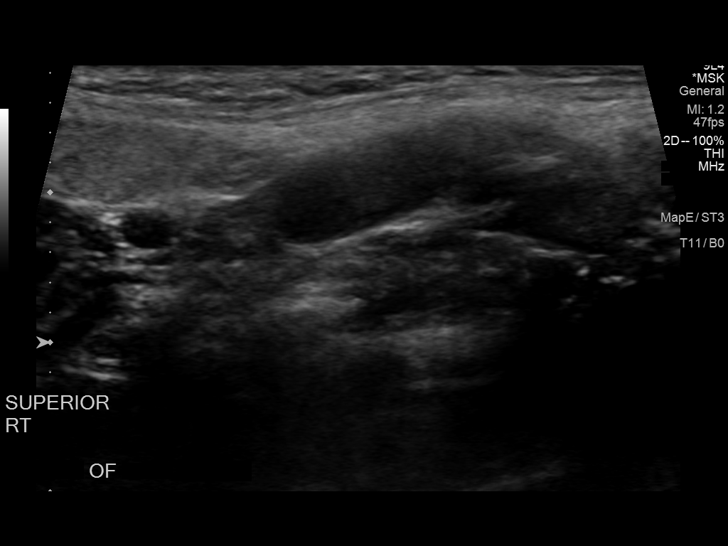
[im 11/14]
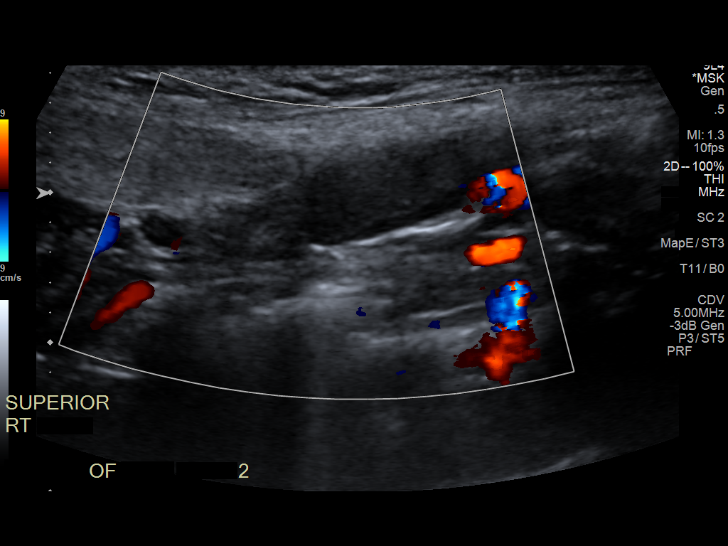
[im 12/14]
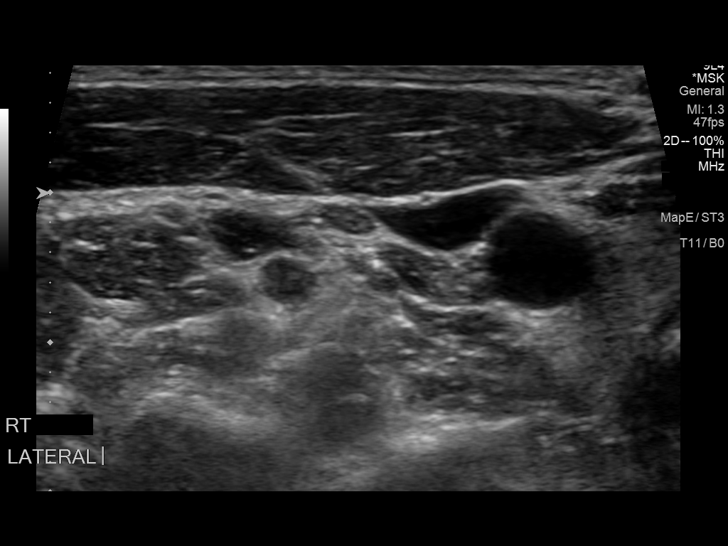
[im 13/14]
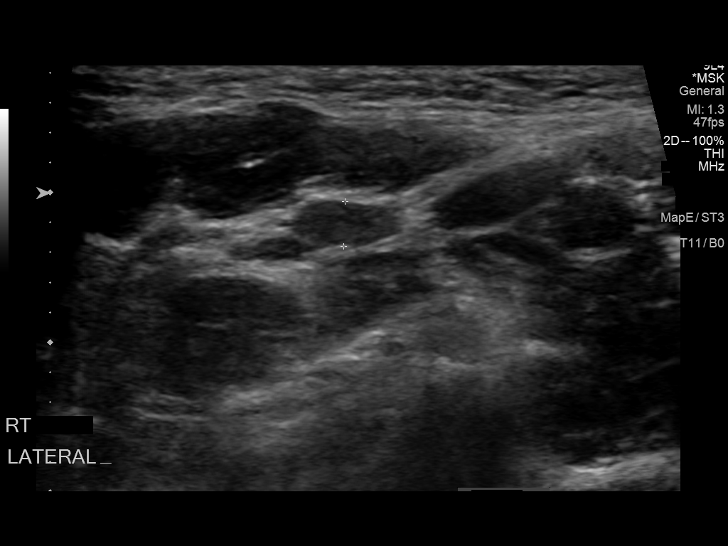
[im 14/14]
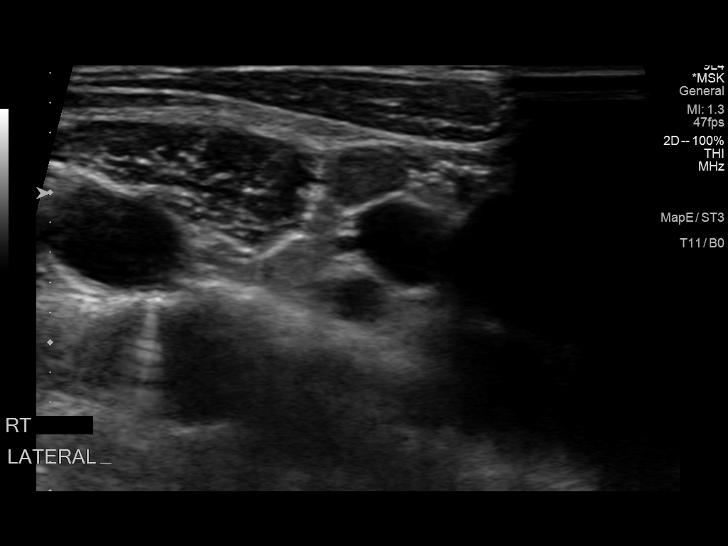

[13 of 14 positions shown; findings below may reference images not displayed]

FINDINGS: Targeted ultrasound of 2 separate palpable areas of concern at the
right lateral neck was performed. Ultrasound of the more superior
area demonstrates a small hypoechoic lymph node measuring 7 mm in
short axis, within normal limits for size. Normal fatty hilum is
faintly visible within this node (image 6).

The second palpable abnormality of concern slightly inferiorly also
appears to correspond with a small hypoechoic lymph node, also
measuring within normal limits for size at 7 mm in short axis. No
definite concerning or pathologic features evident by sonography.

Few additional scattered subcentimeter normal appearing nodes
measuring up to 3 mm noted within the right neck. No pathologic or
enlarged adenopathy identified. No other soft tissue mass or
collection.
IMPRESSION: Two small hypoechoic lymph nodes measuring up to 7 mm in short axis,
corresponding with the palpable abnormalities of concern at the
right lateral neck. No definite concerning or pathologic features
evident by sonography. Findings could be further assessed with
dedicated CT of the neck with contrast as clinically appropriate.

## 2020-02-16 ENCOUNTER — Other Ambulatory Visit: Payer: Self-pay

## 2020-02-16 DIAGNOSIS — R591 Generalized enlarged lymph nodes: Secondary | ICD-10-CM

## 2020-02-18 ENCOUNTER — Encounter: Payer: Self-pay | Admitting: Physician Assistant

## 2020-02-20 ENCOUNTER — Ambulatory Visit (INDEPENDENT_AMBULATORY_CARE_PROVIDER_SITE_OTHER): Payer: BC Managed Care – PPO

## 2020-02-20 ENCOUNTER — Other Ambulatory Visit: Payer: Self-pay

## 2020-02-20 DIAGNOSIS — E538 Deficiency of other specified B group vitamins: Secondary | ICD-10-CM | POA: Diagnosis not present

## 2020-02-20 MED ORDER — CYANOCOBALAMIN 1000 MCG/ML IJ SOLN
1000.0000 ug | Freq: Once | INTRAMUSCULAR | Status: AC
  Administered 2020-02-20: 1000 ug via INTRAMUSCULAR

## 2020-02-20 NOTE — Progress Notes (Signed)
[place] 34 y.o. female presents to office today for 3rd B12 injection per Raiford Noble, PA. Administered CYANOCOBALAMIN 1,000 MCG IM left arm. Patient tolerated well.

## 2020-02-20 NOTE — Progress Notes (Signed)
CMA note reviewed.   Leeanne Rio, PA-C

## 2020-02-21 DIAGNOSIS — J301 Allergic rhinitis due to pollen: Secondary | ICD-10-CM | POA: Diagnosis not present

## 2020-02-21 DIAGNOSIS — J3089 Other allergic rhinitis: Secondary | ICD-10-CM | POA: Diagnosis not present

## 2020-02-21 DIAGNOSIS — J3081 Allergic rhinitis due to animal (cat) (dog) hair and dander: Secondary | ICD-10-CM | POA: Diagnosis not present

## 2020-02-27 ENCOUNTER — Encounter: Payer: Self-pay | Admitting: Physician Assistant

## 2020-02-27 ENCOUNTER — Other Ambulatory Visit: Payer: Self-pay | Admitting: Emergency Medicine

## 2020-02-27 ENCOUNTER — Ambulatory Visit: Payer: BC Managed Care – PPO

## 2020-02-27 DIAGNOSIS — M25562 Pain in left knee: Secondary | ICD-10-CM
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
## 2020-02-28 ENCOUNTER — Ambulatory Visit (INDEPENDENT_AMBULATORY_CARE_PROVIDER_SITE_OTHER): Payer: BC Managed Care – PPO

## 2020-02-28 ENCOUNTER — Ambulatory Visit: Payer: Self-pay

## 2020-02-28 ENCOUNTER — Ambulatory Visit: Payer: BC Managed Care – PPO

## 2020-02-28 ENCOUNTER — Other Ambulatory Visit: Payer: Self-pay

## 2020-02-28 ENCOUNTER — Ambulatory Visit: Payer: BC Managed Care – PPO | Admitting: Family Medicine

## 2020-02-28 ENCOUNTER — Encounter: Payer: Self-pay | Admitting: Family Medicine

## 2020-02-28 VITALS — BP 102/66 | HR 61 | Ht 67.5 in | Wt 134.8 lb

## 2020-02-28 DIAGNOSIS — M25562 Pain in left knee: Secondary | ICD-10-CM

## 2020-02-28 DIAGNOSIS — G8929 Other chronic pain: Secondary | ICD-10-CM
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
## 2020-02-28 NOTE — Patient Instructions (Signed)
Thank you for coming in today.  Please get an Xray today before you leave  You should hear from MRI scheduling within 1 week. If you do not hear please let me know.   Recheck following MRI.

## 2020-02-29 ENCOUNTER — Other Ambulatory Visit (INDEPENDENT_AMBULATORY_CARE_PROVIDER_SITE_OTHER): Payer: BC Managed Care – PPO

## 2020-02-29 ENCOUNTER — Other Ambulatory Visit: Payer: Self-pay | Admitting: Emergency Medicine

## 2020-02-29 ENCOUNTER — Ambulatory Visit (INDEPENDENT_AMBULATORY_CARE_PROVIDER_SITE_OTHER): Payer: BC Managed Care – PPO

## 2020-02-29 DIAGNOSIS — J3081 Allergic rhinitis due to animal (cat) (dog) hair and dander: Secondary | ICD-10-CM | POA: Diagnosis not present

## 2020-02-29 DIAGNOSIS — E538 Deficiency of other specified B group vitamins: Secondary | ICD-10-CM | POA: Diagnosis not present

## 2020-02-29 DIAGNOSIS — D72819 Decreased white blood cell count, unspecified: Secondary | ICD-10-CM

## 2020-02-29 DIAGNOSIS — J3089 Other allergic rhinitis: Secondary | ICD-10-CM | POA: Diagnosis not present

## 2020-02-29 DIAGNOSIS — J301 Allergic rhinitis due to pollen: Secondary | ICD-10-CM | POA: Diagnosis not present

## 2020-02-29 LAB — CBC WITH DIFFERENTIAL/PLATELET
Basophils Absolute: 0.1 10*3/uL (ref 0.0–0.1)
Basophils Relative: 1.4 % (ref 0.0–3.0)
Eosinophils Absolute: 0.1 10*3/uL (ref 0.0–0.7)
Eosinophils Relative: 3.2 % (ref 0.0–5.0)
HCT: 40.1 % (ref 36.0–46.0)
Hemoglobin: 13.6 g/dL (ref 12.0–15.0)
Lymphocytes Relative: 37.3 % (ref 12.0–46.0)
Lymphs Abs: 1.5 10*3/uL (ref 0.7–4.0)
MCHC: 33.8 g/dL (ref 30.0–36.0)
MCV: 92.5 fl (ref 78.0–100.0)
Monocytes Absolute: 0.3 10*3/uL (ref 0.1–1.0)
Monocytes Relative: 6.5 % (ref 3.0–12.0)
Neutro Abs: 2.1 10*3/uL (ref 1.4–7.7)
Neutrophils Relative %: 51.6 % (ref 43.0–77.0)
Platelets: 162 10*3/uL (ref 150.0–400.0)
RBC: 4.34 Mil/uL (ref 3.87–5.11)
RDW: 13.1 % (ref 11.5–15.5)
WBC: 4 10*3/uL (ref 4.0–10.5)

## 2020-02-29 LAB — VITAMIN B12: Vitamin B-12: 467 pg/mL (ref 211–911)

## 2020-02-29 MED ORDER — CYANOCOBALAMIN 1000 MCG/ML IJ SOLN
1000.0000 ug | Freq: Once | INTRAMUSCULAR | Status: AC
  Administered 2020-02-29: 1000 ug via INTRAMUSCULAR

## 2020-02-29 NOTE — Progress Notes (Signed)
Left knee x-ray looks normal to radiology.  MRI should be helpful.

## 2020-02-29 NOTE — Progress Notes (Signed)
Patient tolerated vitamin b12 injection well. No questions or concerns at this time.

## 2020-03-05 ENCOUNTER — Other Ambulatory Visit: Payer: Self-pay | Admitting: Emergency Medicine

## 2020-03-05 DIAGNOSIS — H1045 Other chronic allergic conjunctivitis: Secondary | ICD-10-CM | POA: Diagnosis not present

## 2020-03-05 DIAGNOSIS — J3081 Allergic rhinitis due to animal (cat) (dog) hair and dander: Secondary | ICD-10-CM | POA: Diagnosis not present

## 2020-03-05 DIAGNOSIS — J301 Allergic rhinitis due to pollen: Secondary | ICD-10-CM | POA: Diagnosis not present

## 2020-03-05 DIAGNOSIS — J3089 Other allergic rhinitis: Secondary | ICD-10-CM | POA: Diagnosis not present

## 2020-03-07 DIAGNOSIS — M25562 Pain in left knee: Secondary | ICD-10-CM | POA: Diagnosis not present

## 2020-03-07 DIAGNOSIS — M5459 Other low back pain: Secondary | ICD-10-CM | POA: Diagnosis not present

## 2020-03-07 DIAGNOSIS — R531 Weakness: Secondary | ICD-10-CM | POA: Diagnosis not present

## 2020-03-08 ENCOUNTER — Encounter: Payer: Self-pay | Admitting: Family Medicine
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
## 2020-03-09 ENCOUNTER — Ambulatory Visit
Admission: RE | Admit: 2020-03-09 | Discharge: 2020-03-09 | Disposition: A | Discharge status: Home / Self Care | Payer: BC Managed Care – PPO | Source: Ambulatory Visit | Attending: Physician Assistant | Admitting: Physician Assistant

## 2020-03-09 DIAGNOSIS — M47812 Spondylosis without myelopathy or radiculopathy, cervical region: Secondary | ICD-10-CM | POA: Diagnosis not present

## 2020-03-09 DIAGNOSIS — R591 Generalized enlarged lymph nodes: Secondary | ICD-10-CM

## 2020-03-09 DIAGNOSIS — R599 Enlarged lymph nodes, unspecified: Secondary | ICD-10-CM | POA: Diagnosis not present

## 2020-03-09 DIAGNOSIS — E890 Postprocedural hypothyroidism: Secondary | ICD-10-CM | POA: Diagnosis not present

## 2020-03-09 DIAGNOSIS — E041 Nontoxic single thyroid nodule: Secondary | ICD-10-CM | POA: Diagnosis not present

## 2020-03-09 IMAGING — CT CT NECK W/ CM
5 of 6 series · 14 of 33 positions shown, 16 images · IV contrast (iopamidol)
Comparison: None.

CLINICAL DATA: Enlarging right-sided lymphadenopathy. Previous
partial thyroidectomy.

EXAM:
CT NECK WITH CONTRAST
TECHNIQUE: Multidetector CT imaging of the neck was performed using the
standard protocol following the bolus administration of intravenous
contrast.
CONTRAST:  75mL [ZC] IOPAMIDOL ([ZC]) INJECTION 61%

[Series 2: neck 2.00 br40 s3 st/ no angle · axial · 0.44mm/px · z∈[-759,-673]mm · 2 of 131 slices shown, 3 images]
[im 44/131  soft-tissue]
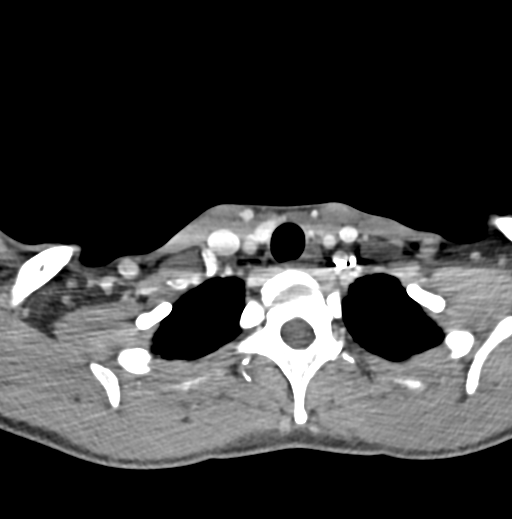
[im 44/131  bone]
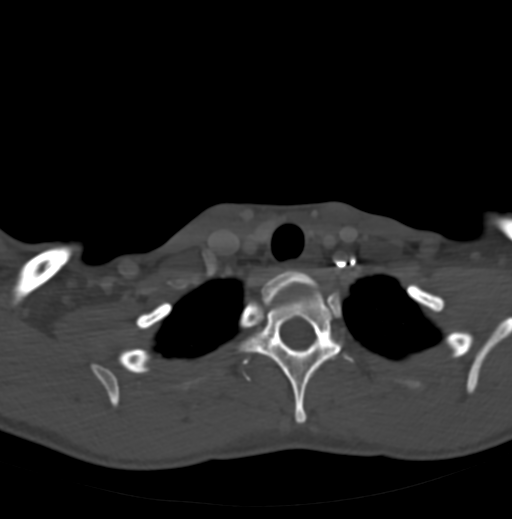
[im 87/131  bone]
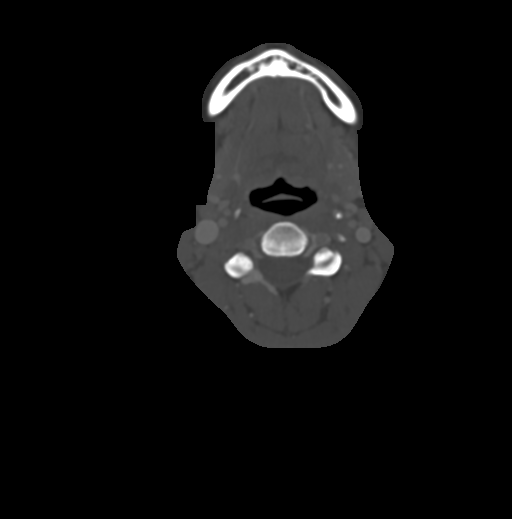

[Series 4: neck 2.00 br60 s3 bone/ no angle · axial · 0.44mm/px · z∈[-759,-673]mm · 2 of 131 slices shown]
[im 44/131  bone]
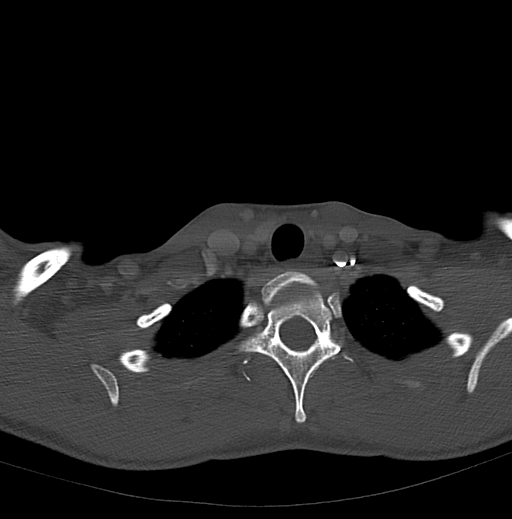
[im 87/131  bone]
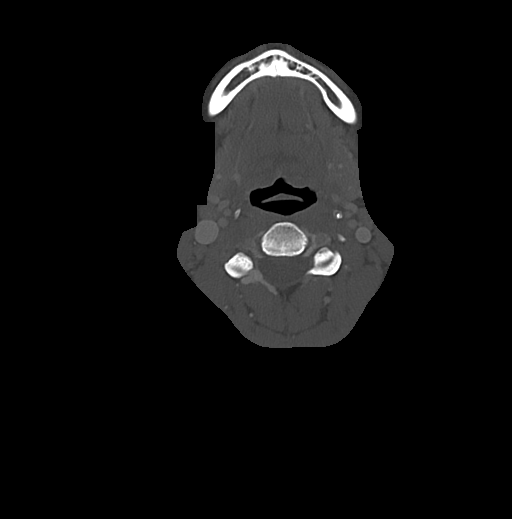

[Series 6: neck 2.00 br36 s3 angled axial (person_name) · axial · 0.44mm/px · z∈[-780,-695]mm · 2 of 131 slices shown]
[im 44/131  bone]
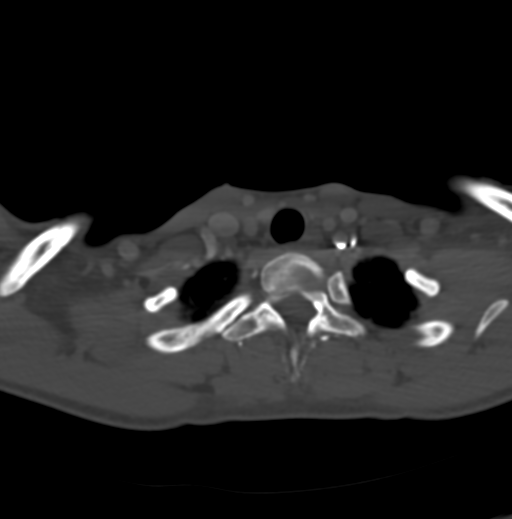
[im 87/131  bone]
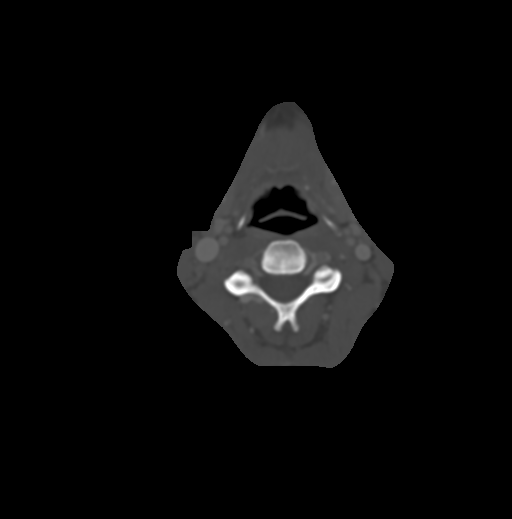

[Series 10: neck 2.00 br40 s3 (person_name) · coronal · 0.44mm/px · 3 of 111 slices shown (1 of 2)]
[im 23/111  bone]
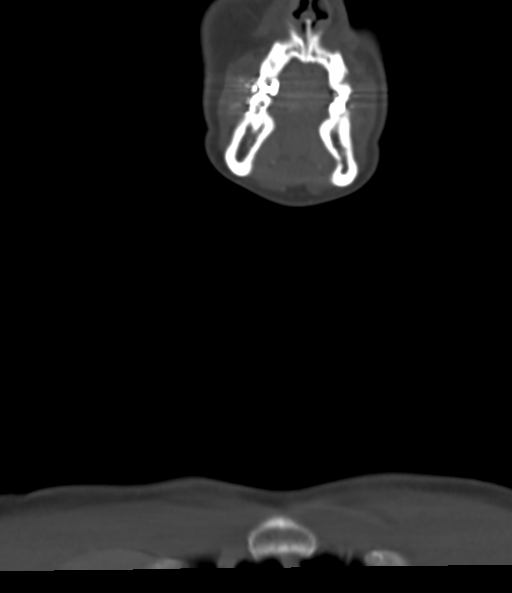
[im 45/111  bone]
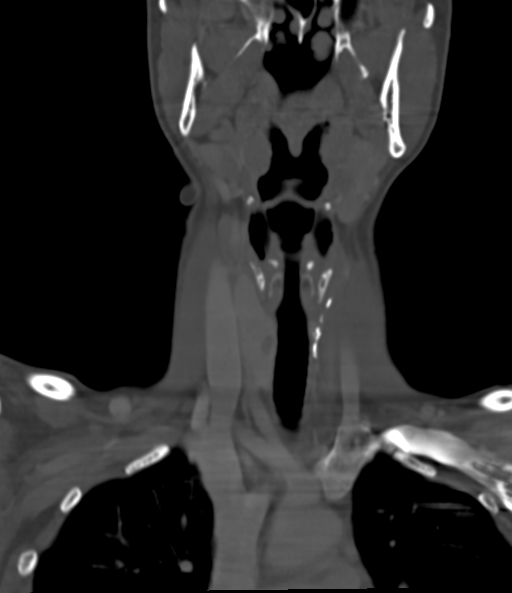
[im 67/111  bone]
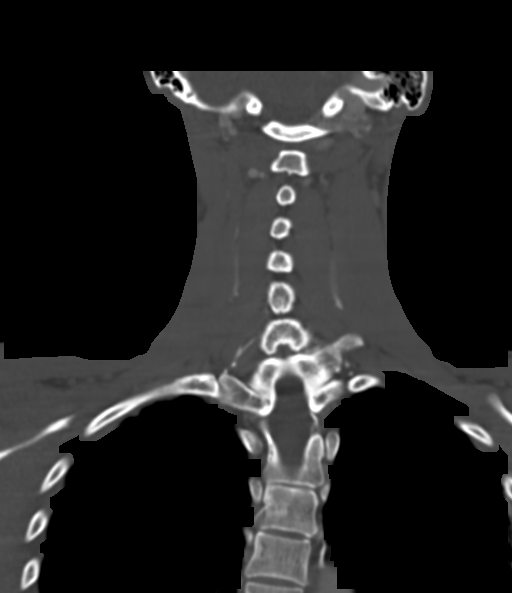

[Series 12: neck 2.00 br40 s3 (person_name) · sagittal · 0.45mm/px · 5 of 113 slices shown, 6 images (2 of 2)]
[im 38/113  bone]
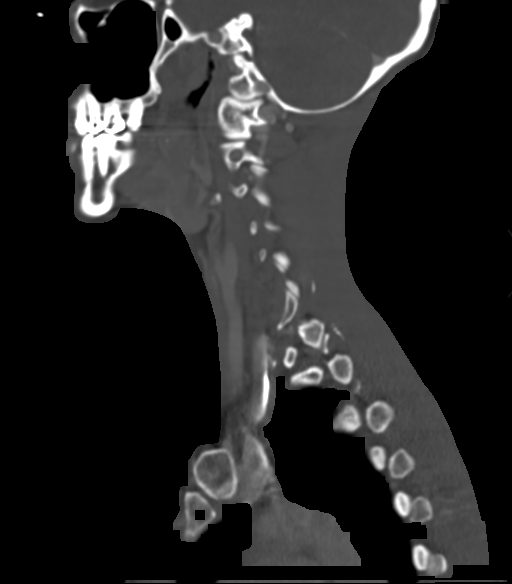
[im 47/113  bone]
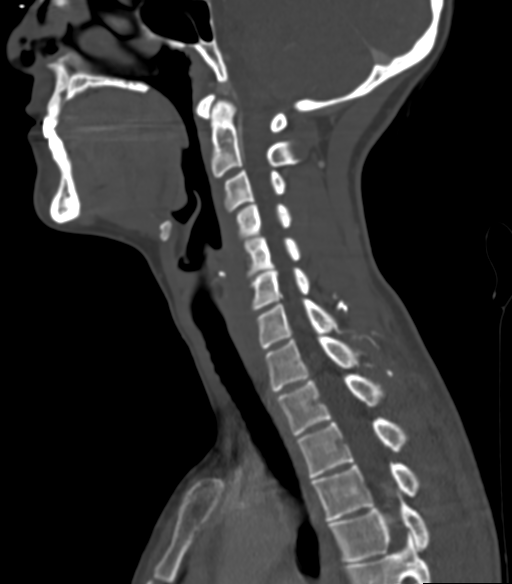
[im 57/113  soft-tissue]
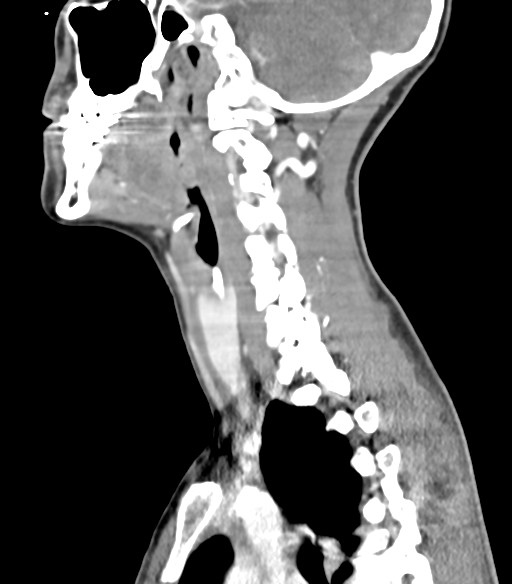
[im 57/113  bone]
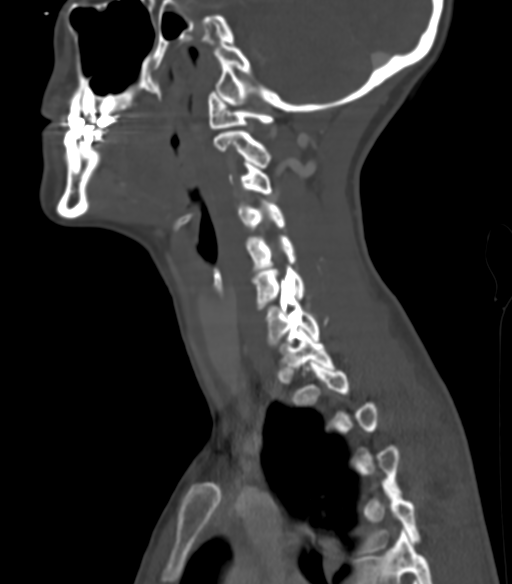
[im 66/113  bone]
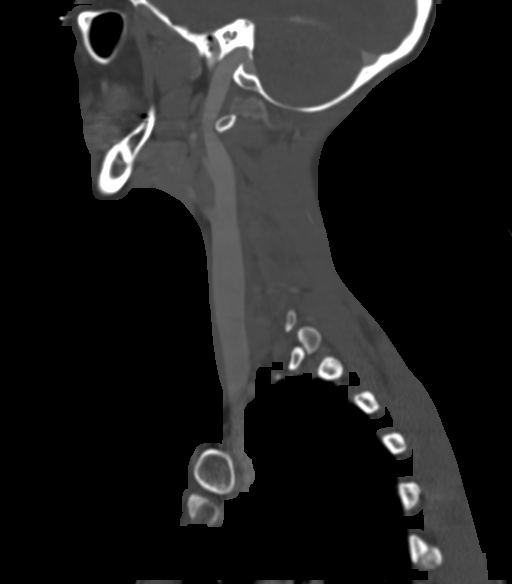
[im 75/113  bone]
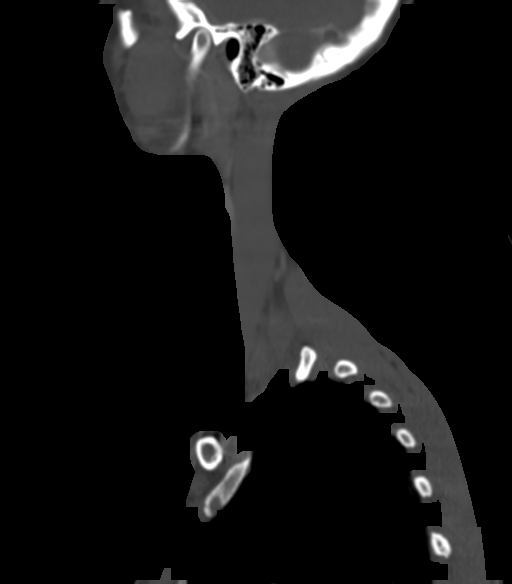

[14 of 33 positions shown; findings below may reference images not displayed]

FINDINGS: Pharynx and larynx: No mucosal or submucosal lesion.

Salivary glands: Both parotid and submandibular glands are normal.

Thyroid: Previous left thyroidectomy. 8 x 5 x 4 mm cyst or nodule in
the right lobe of the thyroid.

Lymph nodes: Skin marker in the region of concern overlies a level 2
node measuring 2 cm in length with a transverse dimension of 7 x 7
mm. This is within normal limits for a level 2 lymph node. No other
cervical lymph nodes are abnormal by size criteria or density
characteristics.

Vascular: Normal

Limited intracranial: Normal

Visualized orbits: Not included.

Mastoids and visualized paranasal sinuses: Normal

Skeleton: Mild cervical spondylosis C5-6.

Upper chest: Normal

Other: None
IMPRESSION: 1. Skin marker in the region of concern overlies a 20 x 7 x 7 mm
level 2 lymph node. This is within normal limits for a level 2 lymph
node. No other cervical lymph nodes are abnormal by size criteria or
density characteristics.
2. Previous left thyroidectomy. 8 x 5 x 4 mm cyst or nodule in the
right lobe of the thyroid. No followup recommended (ref: [HOSPITAL]. [DATE]): 143-50).

## 2020-03-09 MED ORDER — IOPAMIDOL (ISOVUE-300) INJECTION 61%
75.0000 mL | Freq: Once | INTRAVENOUS | Status: AC | PRN
  Administered 2020-03-09: 75 mL via INTRAVENOUS

## 2020-03-12 DIAGNOSIS — R531 Weakness: Secondary | ICD-10-CM | POA: Diagnosis not present

## 2020-03-12 DIAGNOSIS — M25562 Pain in left knee: Secondary | ICD-10-CM | POA: Diagnosis not present

## 2020-03-12 DIAGNOSIS — M5459 Other low back pain: Secondary | ICD-10-CM | POA: Diagnosis not present

## 2020-03-14 DIAGNOSIS — M25562 Pain in left knee: Secondary | ICD-10-CM | POA: Diagnosis not present

## 2020-03-14 DIAGNOSIS — J3081 Allergic rhinitis due to animal (cat) (dog) hair and dander: Secondary | ICD-10-CM | POA: Diagnosis not present

## 2020-03-14 DIAGNOSIS — J3089 Other allergic rhinitis: Secondary | ICD-10-CM | POA: Diagnosis not present

## 2020-03-14 DIAGNOSIS — R531 Weakness: Secondary | ICD-10-CM | POA: Diagnosis not present

## 2020-03-14 DIAGNOSIS — J301 Allergic rhinitis due to pollen: Secondary | ICD-10-CM | POA: Diagnosis not present

## 2020-03-14 DIAGNOSIS — M5459 Other low back pain: Secondary | ICD-10-CM | POA: Diagnosis not present

## 2020-03-19 DIAGNOSIS — M25562 Pain in left knee: Secondary | ICD-10-CM | POA: Diagnosis not present

## 2020-03-19 DIAGNOSIS — R531 Weakness: Secondary | ICD-10-CM | POA: Diagnosis not present

## 2020-03-19 DIAGNOSIS — M5459 Other low back pain: Secondary | ICD-10-CM | POA: Diagnosis not present

## 2020-03-20 DIAGNOSIS — J301 Allergic rhinitis due to pollen: Secondary | ICD-10-CM | POA: Diagnosis not present

## 2020-03-20 DIAGNOSIS — J3081 Allergic rhinitis due to animal (cat) (dog) hair and dander: Secondary | ICD-10-CM | POA: Diagnosis not present

## 2020-03-20 DIAGNOSIS — J3089 Other allergic rhinitis: Secondary | ICD-10-CM | POA: Diagnosis not present

## 2020-03-21 DIAGNOSIS — M5459 Other low back pain: Secondary | ICD-10-CM | POA: Diagnosis not present

## 2020-03-21 DIAGNOSIS — R531 Weakness: Secondary | ICD-10-CM | POA: Diagnosis not present

## 2020-03-21 DIAGNOSIS — M25562 Pain in left knee: Secondary | ICD-10-CM | POA: Diagnosis not present

## 2020-03-26 ENCOUNTER — Ambulatory Visit (INDEPENDENT_AMBULATORY_CARE_PROVIDER_SITE_OTHER): Payer: BC Managed Care – PPO

## 2020-03-26 ENCOUNTER — Other Ambulatory Visit: Payer: Self-pay

## 2020-03-26 DIAGNOSIS — M25562 Pain in left knee: Secondary | ICD-10-CM | POA: Diagnosis not present

## 2020-03-26 DIAGNOSIS — M5459 Other low back pain: Secondary | ICD-10-CM | POA: Diagnosis not present

## 2020-03-26 DIAGNOSIS — M7122 Synovial cyst of popliteal space [Baker], left knee: Secondary | ICD-10-CM | POA: Diagnosis not present

## 2020-03-26 DIAGNOSIS — G8929 Other chronic pain: Secondary | ICD-10-CM | POA: Diagnosis not present

## 2020-03-26 DIAGNOSIS — R531 Weakness: Secondary | ICD-10-CM | POA: Diagnosis not present

## 2020-03-26 DIAGNOSIS — M1712 Unilateral primary osteoarthritis, left knee: Secondary | ICD-10-CM | POA: Diagnosis not present
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
## 2020-03-26 NOTE — Progress Notes (Signed)
MRI of the knee does show some fraying of the lateral meniscus at the lateral part of the knee.  No definitive tear.  I am not sure if this is the cause of your knee pain or not.  Otherwise the knee looks normal.  Recommend return to clinic to go over the results of full detail and discuss treatment plan and options.

## 2020-03-27 DIAGNOSIS — J301 Allergic rhinitis due to pollen: Secondary | ICD-10-CM | POA: Diagnosis not present

## 2020-03-27 DIAGNOSIS — J3089 Other allergic rhinitis: Secondary | ICD-10-CM | POA: Diagnosis not present

## 2020-03-27 DIAGNOSIS — J3081 Allergic rhinitis due to animal (cat) (dog) hair and dander: Secondary | ICD-10-CM | POA: Diagnosis not present
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
## 2020-03-28 ENCOUNTER — Ambulatory Visit (INDEPENDENT_AMBULATORY_CARE_PROVIDER_SITE_OTHER): Payer: BC Managed Care – PPO | Admitting: Family Medicine

## 2020-03-28 ENCOUNTER — Other Ambulatory Visit: Payer: Self-pay

## 2020-03-28 ENCOUNTER — Encounter: Payer: Self-pay | Admitting: Family Medicine

## 2020-03-28 VITALS — BP 102/68 | HR 64 | Ht 67.5 in | Wt 136.2 lb

## 2020-03-28 DIAGNOSIS — S83282A Other tear of lateral meniscus, current injury, left knee, initial encounter: Secondary | ICD-10-CM | POA: Insufficient documentation

## 2020-03-28 DIAGNOSIS — J309 Allergic rhinitis, unspecified: Secondary | ICD-10-CM | POA: Insufficient documentation

## 2020-03-28 DIAGNOSIS — H1045 Other chronic allergic conjunctivitis: Secondary | ICD-10-CM | POA: Insufficient documentation

## 2020-03-28 NOTE — Patient Instructions (Signed)
Thank you for coming in today.  I do think surgical opinion is a good idea.  Continue PT and home exercises.  Let me know how the surgical opinion goes. If you want a second inexpedient opinion let me know.

## 2020-03-29 DIAGNOSIS — R531 Weakness: Secondary | ICD-10-CM | POA: Diagnosis not present

## 2020-03-29 DIAGNOSIS — M25562 Pain in left knee: Secondary | ICD-10-CM | POA: Diagnosis not present

## 2020-03-29 DIAGNOSIS — M5459 Other low back pain: Secondary | ICD-10-CM | POA: Diagnosis not present

## 2020-04-02 DIAGNOSIS — M5459 Other low back pain: Secondary | ICD-10-CM | POA: Diagnosis not present

## 2020-04-02 DIAGNOSIS — M25562 Pain in left knee: Secondary | ICD-10-CM | POA: Diagnosis not present

## 2020-04-02 DIAGNOSIS — R531 Weakness: Secondary | ICD-10-CM | POA: Diagnosis not present

## 2020-04-03 ENCOUNTER — Ambulatory Visit: Payer: BC Managed Care – PPO | Admitting: Orthopaedic Surgery

## 2020-04-03 ENCOUNTER — Other Ambulatory Visit: Payer: Self-pay

## 2020-04-03 ENCOUNTER — Encounter: Payer: Self-pay | Admitting: Family Medicine

## 2020-04-03 ENCOUNTER — Encounter: Payer: Self-pay | Admitting: Orthopaedic Surgery

## 2020-04-03 DIAGNOSIS — G8929 Other chronic pain: Secondary | ICD-10-CM

## 2020-04-03 DIAGNOSIS — M25562 Pain in left knee: Secondary | ICD-10-CM | POA: Diagnosis not present

## 2020-04-03 DIAGNOSIS — S83282A Other tear of lateral meniscus, current injury, left knee, initial encounter: Secondary | ICD-10-CM
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
## 2020-04-04 DIAGNOSIS — R531 Weakness: Secondary | ICD-10-CM | POA: Diagnosis not present

## 2020-04-04 DIAGNOSIS — J3081 Allergic rhinitis due to animal (cat) (dog) hair and dander: Secondary | ICD-10-CM | POA: Diagnosis not present

## 2020-04-04 DIAGNOSIS — M25562 Pain in left knee: Secondary | ICD-10-CM | POA: Diagnosis not present

## 2020-04-04 DIAGNOSIS — M5459 Other low back pain: Secondary | ICD-10-CM | POA: Diagnosis not present

## 2020-04-04 DIAGNOSIS — J3089 Other allergic rhinitis: Secondary | ICD-10-CM | POA: Diagnosis not present

## 2020-04-04 DIAGNOSIS — J301 Allergic rhinitis due to pollen: Secondary | ICD-10-CM | POA: Diagnosis not present

## 2020-04-10 DIAGNOSIS — D225 Melanocytic nevi of trunk: Secondary | ICD-10-CM | POA: Diagnosis not present

## 2020-04-10 DIAGNOSIS — J3089 Other allergic rhinitis: Secondary | ICD-10-CM | POA: Diagnosis not present

## 2020-04-10 DIAGNOSIS — J3081 Allergic rhinitis due to animal (cat) (dog) hair and dander: Secondary | ICD-10-CM | POA: Diagnosis not present

## 2020-04-10 DIAGNOSIS — D485 Neoplasm of uncertain behavior of skin: Secondary | ICD-10-CM | POA: Diagnosis not present

## 2020-04-10 DIAGNOSIS — J301 Allergic rhinitis due to pollen: Secondary | ICD-10-CM | POA: Diagnosis not present

## 2020-04-16 ENCOUNTER — Encounter: Payer: Self-pay | Admitting: Physician Assistant

## 2020-04-16 NOTE — Telephone Encounter (Signed)
I don't see in patient chart of cold sores/fever blisters diagnosis or history of Valtrex prescription. Please advise of medication refill

## 2020-04-17 ENCOUNTER — Telehealth: Payer: BC Managed Care – PPO | Admitting: Nurse Practitioner

## 2020-04-17 DIAGNOSIS — B001 Herpesviral vesicular dermatitis: Secondary | ICD-10-CM

## 2020-04-17 MED ORDER — VALACYCLOVIR HCL 1 G PO TABS: ORAL_TABLET | ORAL | 0 refills | Status: DC

## 2020-04-17 NOTE — Progress Notes (Signed)
We are sorry that you are not feeling well.  Here is how we plan to help!  Based on what you have shared with me it does look like you have a viral infection.    Most cold sores or fever blisters are small fluid filled blisters around the mouth caused by herpes simplex virus.  The most common strain of the virus causing cold sores is herpes simplex virus 1.  It can be spread by skin contact, sharing eating utensils, or even sharing towels.  Cold sores are contagious to other people until dry. (Approximately 5-7 days).  Wash your hands. You can spread the virus to your eyes through handling your contact lenses after touching the lesions.  Most people experience pain at the sight or tingling sensations in their lips that may begin before the ulcers erupt.  Herpes simplex is treatable but not curable.  It may lie dormant for a long time and then reappear due to stress or prolonged sun exposure.  Many patients have success in treating their cold sores with an over the counter topical called Abreva.  You may apply the cream up to 5 times daily (maximum 10 days) until healing occurs.  If you would like to use an oral antiviral medication to speed the healing of your cold sore, I have sent a prescription to your local pharmacy Valacyclovir 2 gm take one by mouth twice a day for 1 day    HOME CARE:  Wash your hands frequently. Do not pick at or rub the sore. Don't open the blisters. Avoid kissing other people during this time. Avoid sharing drinking glasses, eating utensils, or razors. Do not handle contact lenses unless you have thoroughly washed your hands with soap and warm water! Avoid oral sex during this time.  Herpes from sores on your mouth can spread to your partner's genital area. Avoid contact with anyone who has eczema or a weakened immune system. Cold sores are often triggered by exposure to intense sunlight, use a lip balm containing a sunscreen (SPF 30 or higher).  GET HELP RIGHT AWAY  IF:  Blisters look infected. Blisters occur near or in the eye. Symptoms last longer than 10 days. Your symptoms become worse.  MAKE SURE YOU:  Understand these instructions. Will watch your condition. Will get help right away if you are not doing well or get worse.    Your e-visit answers were reviewed by a board certified advanced clinical practitioner to complete your personal care plan.  Depending upon the condition, your plan could have  Included both over the counter or prescription medications.    Please review your pharmacy choice.  Be sure that the pharmacy you have chosen is open so that you can pick up your prescription now.  If there is a problem you can message your provider in MyChart to have the prescription routed to another pharmacy.    Your safety is important to us.  If you have drug allergies check our prescription carefully.  For the next 24 hours you can use MyChart to ask questions about today's visit, request a non-urgent call back, or ask for a work or school excuse from your e-visit provider.  You will get an email in the next two days asking about your experience.  I hope that your e-visit has been valuable and will speed your recovery.  5-10 minutes spent reviewing and documenting in chart.     chart.

## 2020-04-17 NOTE — Telephone Encounter (Signed)
Since this would be a new Rx from [place], she would need appt.

## 2020-04-19 DIAGNOSIS — J301 Allergic rhinitis due to pollen: Secondary | ICD-10-CM | POA: Diagnosis not present

## 2020-04-19 DIAGNOSIS — J3081 Allergic rhinitis due to animal (cat) (dog) hair and dander: Secondary | ICD-10-CM | POA: Diagnosis not present

## 2020-04-19 DIAGNOSIS — J3089 Other allergic rhinitis: Secondary | ICD-10-CM | POA: Diagnosis not present

## 2020-04-25 DIAGNOSIS — J3081 Allergic rhinitis due to animal (cat) (dog) hair and dander: Secondary | ICD-10-CM | POA: Diagnosis not present

## 2020-04-25 DIAGNOSIS — J301 Allergic rhinitis due to pollen: Secondary | ICD-10-CM | POA: Diagnosis not present

## 2020-04-25 DIAGNOSIS — J3089 Other allergic rhinitis: Secondary | ICD-10-CM | POA: Diagnosis not present

## 2020-04-30 DIAGNOSIS — J301 Allergic rhinitis due to pollen: Secondary | ICD-10-CM | POA: Diagnosis not present

## 2020-04-30 DIAGNOSIS — J3081 Allergic rhinitis due to animal (cat) (dog) hair and dander: Secondary | ICD-10-CM | POA: Diagnosis not present

## 2020-04-30 DIAGNOSIS — J3089 Other allergic rhinitis: Secondary | ICD-10-CM | POA: Diagnosis not present

## 2020-05-08 DIAGNOSIS — Z20822 Contact with and (suspected) exposure to covid-19: Secondary | ICD-10-CM | POA: Diagnosis not present

## 2020-05-14 ENCOUNTER — Encounter: Payer: Self-pay | Admitting: Physician Assistant

## 2020-05-18 DIAGNOSIS — M65862 Other synovitis and tenosynovitis, left lower leg: Secondary | ICD-10-CM | POA: Diagnosis not present

## 2020-05-18 DIAGNOSIS — J3089 Other allergic rhinitis: Secondary | ICD-10-CM | POA: Diagnosis not present

## 2020-05-18 DIAGNOSIS — J301 Allergic rhinitis due to pollen: Secondary | ICD-10-CM | POA: Diagnosis not present

## 2020-05-18 DIAGNOSIS — M2392 Unspecified internal derangement of left knee: Secondary | ICD-10-CM | POA: Diagnosis not present

## 2020-05-18 DIAGNOSIS — S83282A Other tear of lateral meniscus, current injury, left knee, initial encounter: Secondary | ICD-10-CM | POA: Diagnosis not present

## 2020-05-18 DIAGNOSIS — J3081 Allergic rhinitis due to animal (cat) (dog) hair and dander: Secondary | ICD-10-CM | POA: Diagnosis not present

## 2020-05-31 DIAGNOSIS — J3081 Allergic rhinitis due to animal (cat) (dog) hair and dander: Secondary | ICD-10-CM | POA: Diagnosis not present

## 2020-05-31 DIAGNOSIS — J301 Allergic rhinitis due to pollen: Secondary | ICD-10-CM | POA: Diagnosis not present

## 2020-05-31 DIAGNOSIS — J3089 Other allergic rhinitis: Secondary | ICD-10-CM | POA: Diagnosis not present

## 2020-06-01 DIAGNOSIS — M25562 Pain in left knee: Secondary | ICD-10-CM | POA: Diagnosis not present

## 2020-06-01 DIAGNOSIS — E65 Localized adiposity: Secondary | ICD-10-CM | POA: Diagnosis not present

## 2020-06-01 DIAGNOSIS — M2392 Unspecified internal derangement of left knee: Secondary | ICD-10-CM | POA: Diagnosis not present

## 2020-06-06 DIAGNOSIS — J3081 Allergic rhinitis due to animal (cat) (dog) hair and dander: Secondary | ICD-10-CM | POA: Diagnosis not present

## 2020-06-06 DIAGNOSIS — J3089 Other allergic rhinitis: Secondary | ICD-10-CM | POA: Diagnosis not present

## 2020-06-06 DIAGNOSIS — J301 Allergic rhinitis due to pollen: Secondary | ICD-10-CM | POA: Diagnosis not present

## 2020-06-13 DIAGNOSIS — J301 Allergic rhinitis due to pollen: Secondary | ICD-10-CM | POA: Diagnosis not present

## 2020-06-13 DIAGNOSIS — J3089 Other allergic rhinitis: Secondary | ICD-10-CM | POA: Diagnosis not present

## 2020-06-13 DIAGNOSIS — J3081 Allergic rhinitis due to animal (cat) (dog) hair and dander: Secondary | ICD-10-CM | POA: Diagnosis not present

## 2020-06-19 DIAGNOSIS — G8918 Other acute postprocedural pain: Secondary | ICD-10-CM | POA: Diagnosis not present

## 2020-06-19 DIAGNOSIS — M2242 Chondromalacia patellae, left knee: Secondary | ICD-10-CM | POA: Diagnosis not present

## 2020-06-19 DIAGNOSIS — M794 Hypertrophy of (infrapatellar) fat pad: Secondary | ICD-10-CM | POA: Diagnosis not present

## 2020-06-19 DIAGNOSIS — M65862 Other synovitis and tenosynovitis, left lower leg: Secondary | ICD-10-CM | POA: Diagnosis not present

## 2020-06-19 DIAGNOSIS — M94262 Chondromalacia, left knee: Secondary | ICD-10-CM | POA: Diagnosis not present

## 2020-06-19 DIAGNOSIS — M659 Synovitis and tenosynovitis, unspecified: Secondary | ICD-10-CM | POA: Diagnosis not present

## 2020-06-19 HISTORY — PX: OTHER SURGICAL HISTORY: SHX169

## 2020-06-25 DIAGNOSIS — J3081 Allergic rhinitis due to animal (cat) (dog) hair and dander: Secondary | ICD-10-CM | POA: Diagnosis not present

## 2020-06-25 DIAGNOSIS — J3089 Other allergic rhinitis: Secondary | ICD-10-CM | POA: Diagnosis not present

## 2020-06-25 DIAGNOSIS — J301 Allergic rhinitis due to pollen: Secondary | ICD-10-CM | POA: Diagnosis not present

## 2020-06-28 DIAGNOSIS — M2242 Chondromalacia patellae, left knee: Secondary | ICD-10-CM | POA: Diagnosis not present

## 2020-06-28 DIAGNOSIS — D1801 Hemangioma of skin and subcutaneous tissue: Secondary | ICD-10-CM | POA: Diagnosis not present

## 2020-06-28 DIAGNOSIS — L905 Scar conditions and fibrosis of skin: Secondary | ICD-10-CM | POA: Diagnosis not present

## 2020-06-28 DIAGNOSIS — L814 Other melanin hyperpigmentation: Secondary | ICD-10-CM | POA: Diagnosis not present

## 2020-06-28 DIAGNOSIS — M794 Hypertrophy of (infrapatellar) fat pad: Secondary | ICD-10-CM | POA: Diagnosis not present

## 2020-06-28 DIAGNOSIS — L821 Other seborrheic keratosis: Secondary | ICD-10-CM | POA: Diagnosis not present

## 2020-06-29 DIAGNOSIS — J3089 Other allergic rhinitis: Secondary | ICD-10-CM | POA: Diagnosis not present

## 2020-06-29 DIAGNOSIS — J301 Allergic rhinitis due to pollen: Secondary | ICD-10-CM | POA: Diagnosis not present

## 2020-06-29 DIAGNOSIS — J3081 Allergic rhinitis due to animal (cat) (dog) hair and dander: Secondary | ICD-10-CM | POA: Diagnosis not present

## 2020-07-03 DIAGNOSIS — J3081 Allergic rhinitis due to animal (cat) (dog) hair and dander: Secondary | ICD-10-CM | POA: Diagnosis not present

## 2020-07-03 DIAGNOSIS — J301 Allergic rhinitis due to pollen: Secondary | ICD-10-CM | POA: Diagnosis not present

## 2020-07-03 DIAGNOSIS — J3089 Other allergic rhinitis: Secondary | ICD-10-CM | POA: Diagnosis not present

## 2020-07-04 DIAGNOSIS — M2242 Chondromalacia patellae, left knee: Secondary | ICD-10-CM | POA: Diagnosis not present

## 2020-07-04 DIAGNOSIS — M794 Hypertrophy of (infrapatellar) fat pad: Secondary | ICD-10-CM | POA: Diagnosis not present

## 2020-07-06 DIAGNOSIS — M2242 Chondromalacia patellae, left knee: Secondary | ICD-10-CM | POA: Diagnosis not present

## 2020-07-06 DIAGNOSIS — M794 Hypertrophy of (infrapatellar) fat pad: Secondary | ICD-10-CM | POA: Diagnosis not present

## 2020-07-10 ENCOUNTER — Encounter: Payer: Self-pay | Admitting: Family Medicine

## 2020-07-10 ENCOUNTER — Ambulatory Visit: Payer: Self-pay

## 2020-07-10 ENCOUNTER — Ambulatory Visit (INDEPENDENT_AMBULATORY_CARE_PROVIDER_SITE_OTHER): Payer: BC Managed Care – PPO

## 2020-07-10 ENCOUNTER — Other Ambulatory Visit: Payer: Self-pay

## 2020-07-10 ENCOUNTER — Ambulatory Visit: Payer: BC Managed Care – PPO | Admitting: Family Medicine

## 2020-07-10 VITALS — BP 102/70 | HR 64 | Ht 67.5 in | Wt 140.0 lb

## 2020-07-10 DIAGNOSIS — M25561 Pain in right knee: Secondary | ICD-10-CM
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
## 2020-07-10 NOTE — Patient Instructions (Signed)
Thank you for coming in today.  If not improved we can do an injection.   3 weeks is our recheck mark for this.   6 weeks is MRI mark.   Quad strength.   Voltaren gel and compression sleeve.

## 2020-07-11 DIAGNOSIS — M2242 Chondromalacia patellae, left knee: Secondary | ICD-10-CM | POA: Diagnosis not present

## 2020-07-11 DIAGNOSIS — M794 Hypertrophy of (infrapatellar) fat pad: Secondary | ICD-10-CM | POA: Diagnosis not present

## 2020-07-12 NOTE — Progress Notes (Signed)
Knee x-ray basically looks normal.

## 2020-07-13 DIAGNOSIS — M794 Hypertrophy of (infrapatellar) fat pad: Secondary | ICD-10-CM | POA: Diagnosis not present

## 2020-07-13 DIAGNOSIS — M2242 Chondromalacia patellae, left knee: Secondary | ICD-10-CM | POA: Diagnosis not present

## 2020-07-16 DIAGNOSIS — J3089 Other allergic rhinitis: Secondary | ICD-10-CM | POA: Diagnosis not present

## 2020-07-16 DIAGNOSIS — M2242 Chondromalacia patellae, left knee: Secondary | ICD-10-CM | POA: Diagnosis not present

## 2020-07-16 DIAGNOSIS — M794 Hypertrophy of (infrapatellar) fat pad: Secondary | ICD-10-CM | POA: Diagnosis not present

## 2020-07-16 DIAGNOSIS — J3081 Allergic rhinitis due to animal (cat) (dog) hair and dander: Secondary | ICD-10-CM | POA: Diagnosis not present

## 2020-07-16 DIAGNOSIS — J301 Allergic rhinitis due to pollen: Secondary | ICD-10-CM | POA: Diagnosis not present

## 2020-07-18 DIAGNOSIS — M794 Hypertrophy of (infrapatellar) fat pad: Secondary | ICD-10-CM | POA: Diagnosis not present

## 2020-07-18 DIAGNOSIS — M2242 Chondromalacia patellae, left knee: Secondary | ICD-10-CM | POA: Diagnosis not present

## 2020-07-24 DIAGNOSIS — J301 Allergic rhinitis due to pollen: Secondary | ICD-10-CM | POA: Diagnosis not present

## 2020-07-24 DIAGNOSIS — J3081 Allergic rhinitis due to animal (cat) (dog) hair and dander: Secondary | ICD-10-CM | POA: Diagnosis not present

## 2020-07-24 DIAGNOSIS — J3089 Other allergic rhinitis: Secondary | ICD-10-CM | POA: Diagnosis not present
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
## 2020-07-30 ENCOUNTER — Other Ambulatory Visit: Payer: Self-pay

## 2020-07-30 ENCOUNTER — Ambulatory Visit (INDEPENDENT_AMBULATORY_CARE_PROVIDER_SITE_OTHER): Payer: BC Managed Care – PPO | Admitting: Family Medicine

## 2020-07-30 ENCOUNTER — Ambulatory Visit: Payer: Self-pay

## 2020-07-30 VITALS — BP 118/76 | HR 67 | Ht 67.5 in | Wt 140.4 lb

## 2020-07-30 DIAGNOSIS — M25561 Pain in right knee: Secondary | ICD-10-CM

## 2020-07-30 DIAGNOSIS — J301 Allergic rhinitis due to pollen: Secondary | ICD-10-CM | POA: Diagnosis not present

## 2020-07-30 DIAGNOSIS — J3089 Other allergic rhinitis: Secondary | ICD-10-CM | POA: Diagnosis not present

## 2020-07-30 DIAGNOSIS — J3081 Allergic rhinitis due to animal (cat) (dog) hair and dander: Secondary | ICD-10-CM | POA: Diagnosis not present

## 2020-07-30 NOTE — Patient Instructions (Signed)
Thank you for coming in today.  The knee looks and feels better today.   Continue normal treatment.   When life get a little better and you can do more if the knee hurts again let me know and I can inject it if needed.   If not better can do MRI.   Your time will be spoken for a little while.

## 2020-08-08 DIAGNOSIS — J3089 Other allergic rhinitis: Secondary | ICD-10-CM | POA: Diagnosis not present

## 2020-08-08 DIAGNOSIS — J301 Allergic rhinitis due to pollen: Secondary | ICD-10-CM | POA: Diagnosis not present

## 2020-08-08 DIAGNOSIS — J3081 Allergic rhinitis due to animal (cat) (dog) hair and dander: Secondary | ICD-10-CM | POA: Diagnosis not present

## 2020-08-16 DIAGNOSIS — J301 Allergic rhinitis due to pollen: Secondary | ICD-10-CM | POA: Diagnosis not present

## 2020-08-16 DIAGNOSIS — J3089 Other allergic rhinitis: Secondary | ICD-10-CM | POA: Diagnosis not present

## 2020-08-16 DIAGNOSIS — J3081 Allergic rhinitis due to animal (cat) (dog) hair and dander: Secondary | ICD-10-CM | POA: Diagnosis not present

## 2020-08-21 DIAGNOSIS — J3089 Other allergic rhinitis: Secondary | ICD-10-CM | POA: Diagnosis not present

## 2020-08-21 DIAGNOSIS — J301 Allergic rhinitis due to pollen: Secondary | ICD-10-CM | POA: Diagnosis not present

## 2020-08-21 DIAGNOSIS — J3081 Allergic rhinitis due to animal (cat) (dog) hair and dander: Secondary | ICD-10-CM | POA: Diagnosis not present

## 2020-08-28 DIAGNOSIS — J301 Allergic rhinitis due to pollen: Secondary | ICD-10-CM | POA: Diagnosis not present

## 2020-08-28 DIAGNOSIS — J3089 Other allergic rhinitis: Secondary | ICD-10-CM | POA: Diagnosis not present

## 2020-08-28 DIAGNOSIS — J3081 Allergic rhinitis due to animal (cat) (dog) hair and dander: Secondary | ICD-10-CM | POA: Diagnosis not present

## 2020-09-04 DIAGNOSIS — J3089 Other allergic rhinitis: Secondary | ICD-10-CM | POA: Diagnosis not present

## 2020-09-04 DIAGNOSIS — J3081 Allergic rhinitis due to animal (cat) (dog) hair and dander: Secondary | ICD-10-CM | POA: Diagnosis not present

## 2020-09-04 DIAGNOSIS — J301 Allergic rhinitis due to pollen: Secondary | ICD-10-CM | POA: Diagnosis not present

## 2020-09-10 DIAGNOSIS — J301 Allergic rhinitis due to pollen: Secondary | ICD-10-CM | POA: Diagnosis not present

## 2020-09-10 DIAGNOSIS — J3089 Other allergic rhinitis: Secondary | ICD-10-CM | POA: Diagnosis not present

## 2020-09-10 DIAGNOSIS — J3081 Allergic rhinitis due to animal (cat) (dog) hair and dander: Secondary | ICD-10-CM | POA: Diagnosis not present

## 2020-09-19 DIAGNOSIS — J301 Allergic rhinitis due to pollen: Secondary | ICD-10-CM | POA: Diagnosis not present

## 2020-09-19 DIAGNOSIS — J3089 Other allergic rhinitis: Secondary | ICD-10-CM | POA: Diagnosis not present

## 2020-09-19 DIAGNOSIS — J3081 Allergic rhinitis due to animal (cat) (dog) hair and dander: Secondary | ICD-10-CM | POA: Diagnosis not present

## 2020-09-25 DIAGNOSIS — J301 Allergic rhinitis due to pollen: Secondary | ICD-10-CM | POA: Diagnosis not present

## 2020-09-25 DIAGNOSIS — J3089 Other allergic rhinitis: Secondary | ICD-10-CM | POA: Diagnosis not present

## 2020-10-02 DIAGNOSIS — J3089 Other allergic rhinitis: Secondary | ICD-10-CM | POA: Diagnosis not present

## 2020-10-02 DIAGNOSIS — J301 Allergic rhinitis due to pollen: Secondary | ICD-10-CM | POA: Diagnosis not present

## 2020-10-11 DIAGNOSIS — J3081 Allergic rhinitis due to animal (cat) (dog) hair and dander: Secondary | ICD-10-CM | POA: Diagnosis not present

## 2020-10-11 DIAGNOSIS — J301 Allergic rhinitis due to pollen: Secondary | ICD-10-CM | POA: Diagnosis not present

## 2020-10-11 DIAGNOSIS — J3089 Other allergic rhinitis: Secondary | ICD-10-CM | POA: Diagnosis not present

## 2020-10-17 DIAGNOSIS — J3089 Other allergic rhinitis: Secondary | ICD-10-CM | POA: Diagnosis not present

## 2020-10-17 DIAGNOSIS — J301 Allergic rhinitis due to pollen: Secondary | ICD-10-CM | POA: Diagnosis not present

## 2020-10-17 DIAGNOSIS — J3081 Allergic rhinitis due to animal (cat) (dog) hair and dander: Secondary | ICD-10-CM | POA: Diagnosis not present

## 2020-10-24 DIAGNOSIS — J301 Allergic rhinitis due to pollen: Secondary | ICD-10-CM | POA: Diagnosis not present

## 2020-10-24 DIAGNOSIS — J3081 Allergic rhinitis due to animal (cat) (dog) hair and dander: Secondary | ICD-10-CM | POA: Diagnosis not present

## 2020-10-24 DIAGNOSIS — J3089 Other allergic rhinitis: Secondary | ICD-10-CM | POA: Diagnosis not present

## 2020-10-30 DIAGNOSIS — J3081 Allergic rhinitis due to animal (cat) (dog) hair and dander: Secondary | ICD-10-CM | POA: Diagnosis not present

## 2020-10-30 DIAGNOSIS — J301 Allergic rhinitis due to pollen: Secondary | ICD-10-CM | POA: Diagnosis not present

## 2020-10-30 DIAGNOSIS — J3089 Other allergic rhinitis: Secondary | ICD-10-CM | POA: Diagnosis not present

## 2020-11-05 DIAGNOSIS — J3081 Allergic rhinitis due to animal (cat) (dog) hair and dander: Secondary | ICD-10-CM | POA: Diagnosis not present

## 2020-11-05 DIAGNOSIS — J301 Allergic rhinitis due to pollen: Secondary | ICD-10-CM | POA: Diagnosis not present

## 2020-11-06 DIAGNOSIS — Z0000 Encounter for general adult medical examination without abnormal findings: Secondary | ICD-10-CM | POA: Diagnosis not present

## 2020-11-06 DIAGNOSIS — Z131 Encounter for screening for diabetes mellitus: Secondary | ICD-10-CM | POA: Diagnosis not present

## 2020-11-06 DIAGNOSIS — E559 Vitamin D deficiency, unspecified: Secondary | ICD-10-CM | POA: Diagnosis not present

## 2020-11-06 DIAGNOSIS — E538 Deficiency of other specified B group vitamins: Secondary | ICD-10-CM | POA: Diagnosis not present

## 2020-11-06 DIAGNOSIS — Z13220 Encounter for screening for lipoid disorders: Secondary | ICD-10-CM | POA: Diagnosis not present

## 2020-11-06 DIAGNOSIS — R7989 Other specified abnormal findings of blood chemistry: Secondary | ICD-10-CM | POA: Diagnosis not present

## 2020-11-06 DIAGNOSIS — J3089 Other allergic rhinitis: Secondary | ICD-10-CM | POA: Diagnosis not present
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
## 2020-11-08 ENCOUNTER — Encounter: Payer: Self-pay | Admitting: Family Medicine

## 2020-11-08 ENCOUNTER — Ambulatory Visit (INDEPENDENT_AMBULATORY_CARE_PROVIDER_SITE_OTHER): Payer: BC Managed Care – PPO

## 2020-11-08 ENCOUNTER — Ambulatory Visit (INDEPENDENT_AMBULATORY_CARE_PROVIDER_SITE_OTHER): Payer: BC Managed Care – PPO | Admitting: Family Medicine

## 2020-11-08 ENCOUNTER — Other Ambulatory Visit: Payer: Self-pay

## 2020-11-08 VITALS — BP 114/62 | HR 52 | Ht 67.5 in | Wt 141.8 lb

## 2020-11-08 DIAGNOSIS — J3081 Allergic rhinitis due to animal (cat) (dog) hair and dander: Secondary | ICD-10-CM | POA: Diagnosis not present

## 2020-11-08 DIAGNOSIS — G8929 Other chronic pain: Secondary | ICD-10-CM

## 2020-11-08 DIAGNOSIS — R202 Paresthesia of skin: Secondary | ICD-10-CM

## 2020-11-08 DIAGNOSIS — M21372 Foot drop, left foot: Secondary | ICD-10-CM

## 2020-11-08 DIAGNOSIS — M545 Low back pain, unspecified: Secondary | ICD-10-CM

## 2020-11-08 DIAGNOSIS — J3089 Other allergic rhinitis: Secondary | ICD-10-CM | POA: Diagnosis not present

## 2020-11-08 DIAGNOSIS — J301 Allergic rhinitis due to pollen: Secondary | ICD-10-CM | POA: Diagnosis not present

## 2020-11-08 IMAGING — DX DG LUMBAR SPINE 2-3V
3 series · 3 of 3 positions shown · non-contrast
Comparison: None.

CLINICAL DATA: Low back pain

EXAM:
LUMBAR SPINE - 3 VIEW

[l-spine ap]
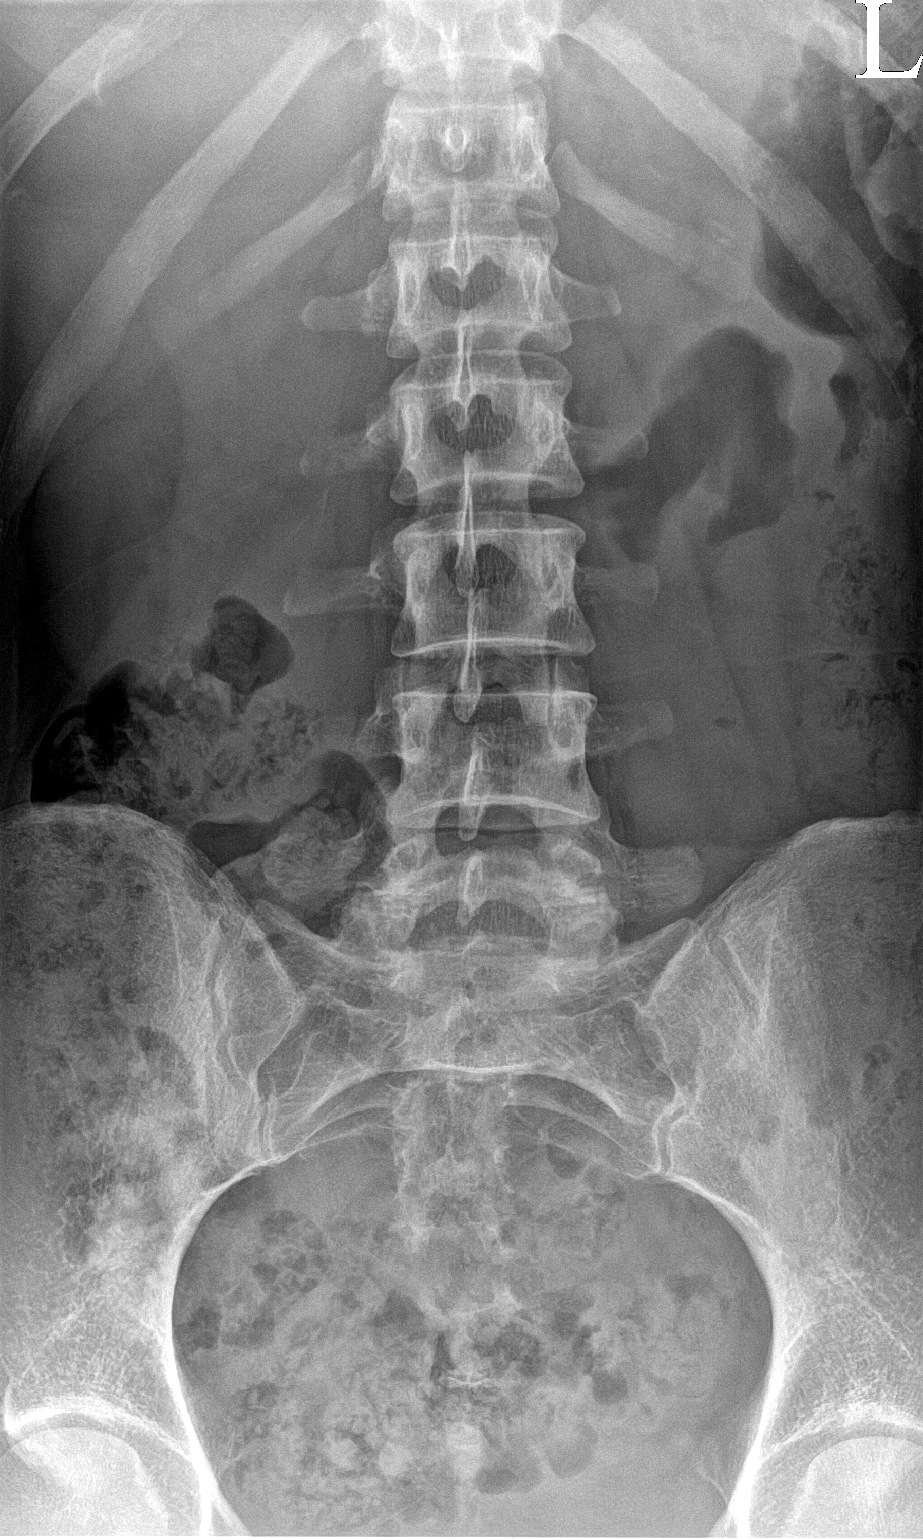

[l-spine lateral (1 of 2)]
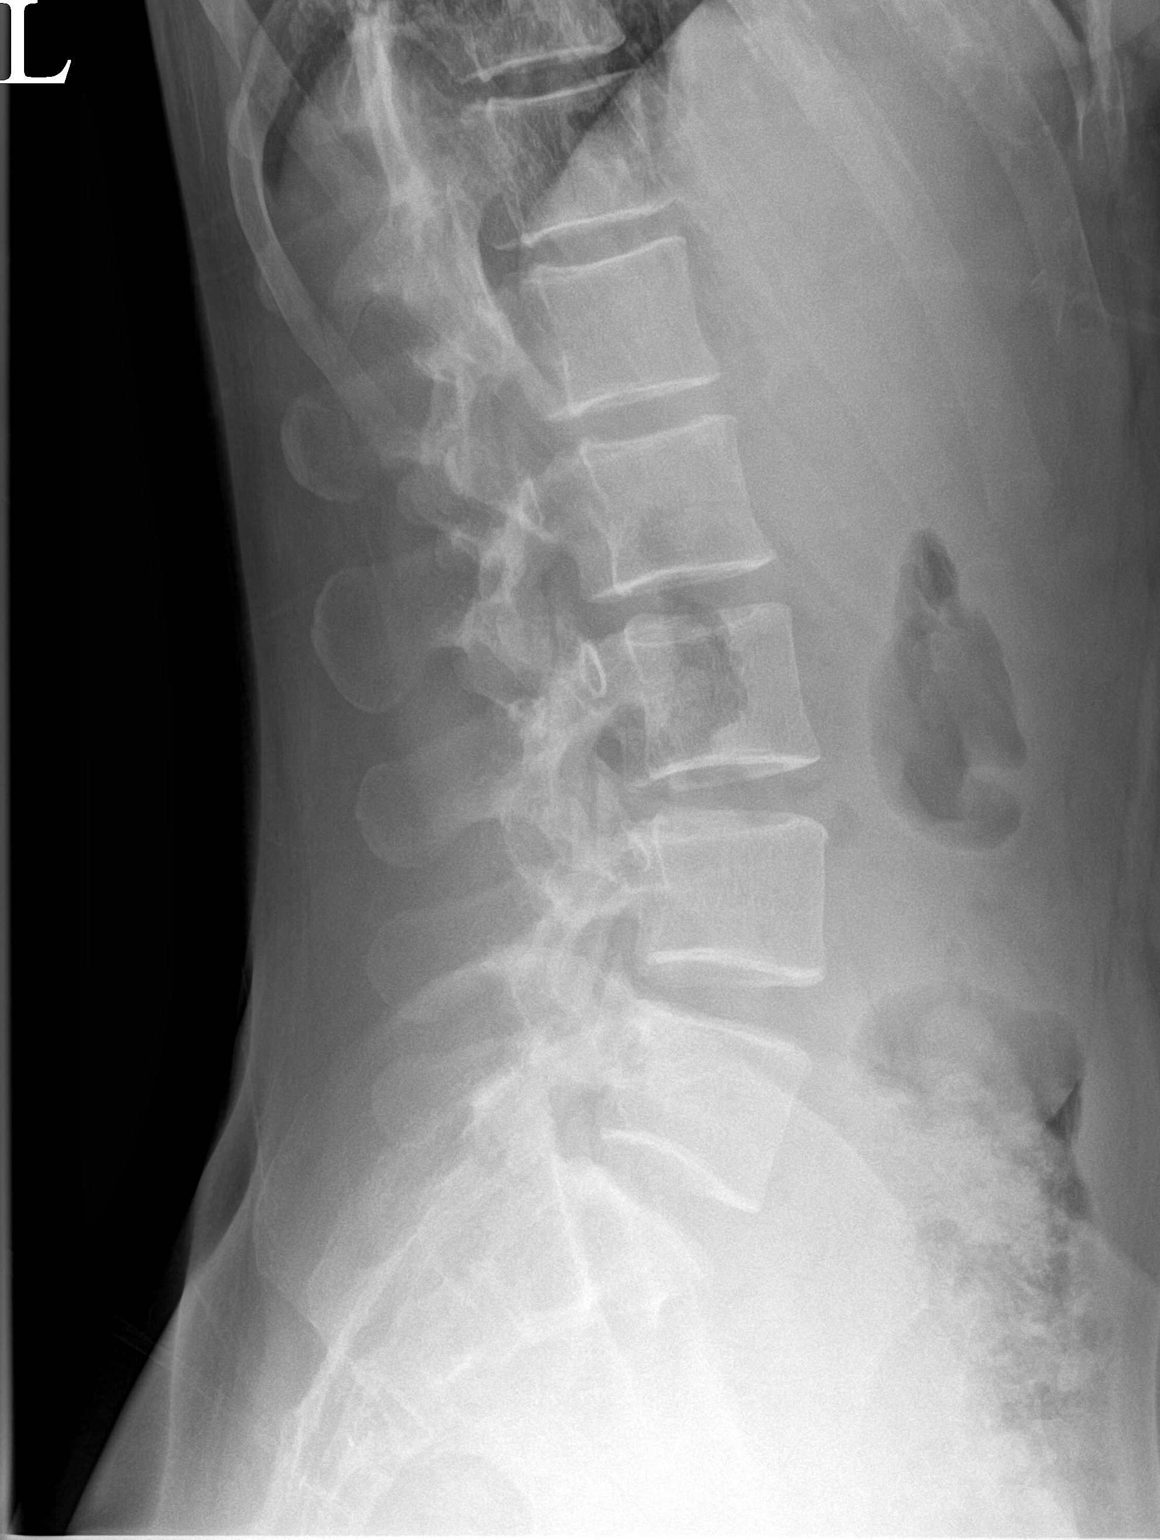

[l-spine lateral (2 of 2)]
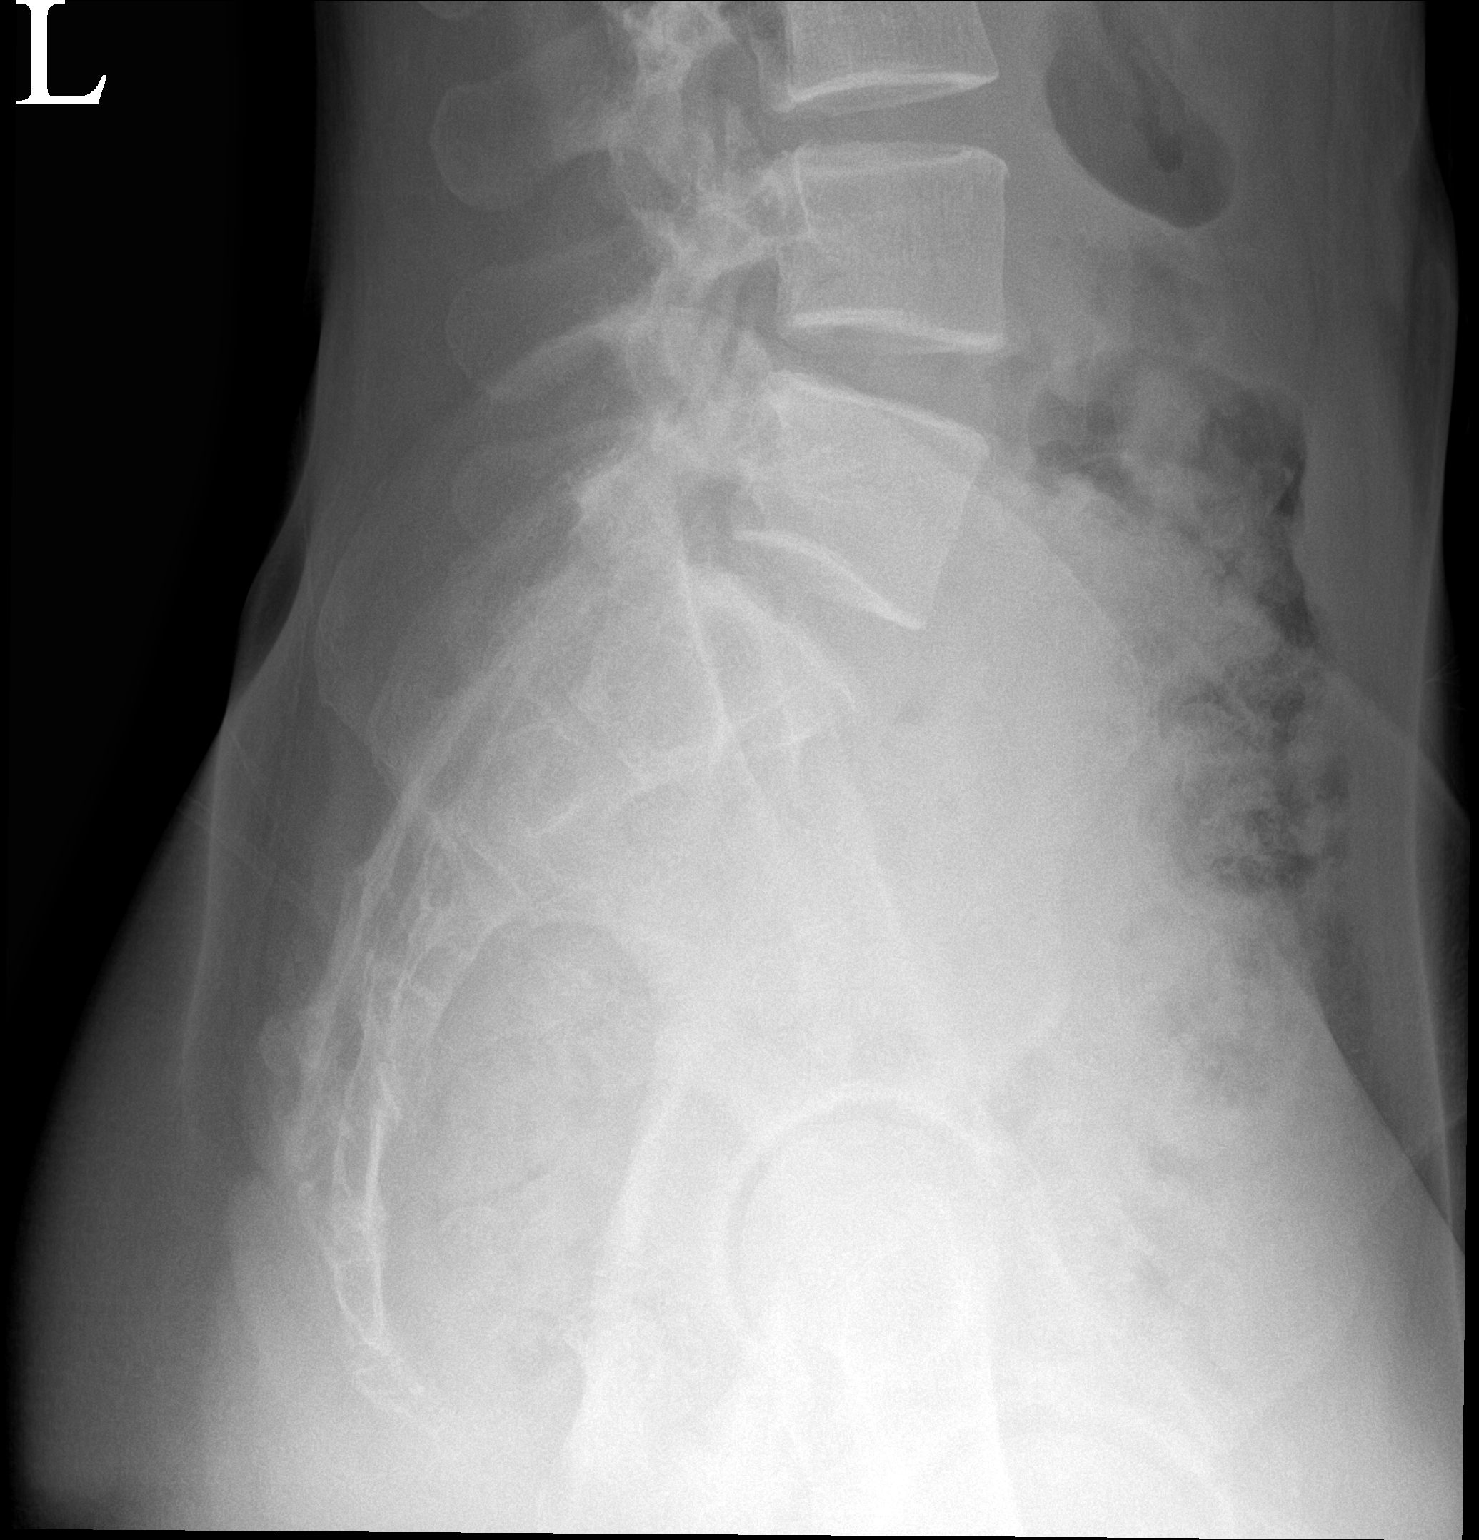

[3 of 3 positions shown; findings below may reference images not displayed]

FINDINGS: There is no evidence of lumbar spine fracture. Alignment is normal.
Intervertebral disc spaces are maintained.
IMPRESSION: No acute abnormality noted.

## 2020-11-08 NOTE — Patient Instructions (Signed)
Thank you for coming in today.   Please get an Xray today before you leave   You should hear from MRI scheduling within 1 week. If you do not hear please let me know.    You should hear form neurology soon about nerve conduction study and appointment.   Recheck after the MRI if the nerve study is not soon.

## 2020-11-09 ENCOUNTER — Encounter: Payer: Self-pay | Admitting: Neurology

## 2020-11-11 ENCOUNTER — Ambulatory Visit (INDEPENDENT_AMBULATORY_CARE_PROVIDER_SITE_OTHER): Payer: BC Managed Care – PPO

## 2020-11-11 ENCOUNTER — Other Ambulatory Visit: Payer: Self-pay

## 2020-11-11 DIAGNOSIS — M21372 Foot drop, left foot: Secondary | ICD-10-CM | POA: Diagnosis not present

## 2020-11-11 DIAGNOSIS — R202 Paresthesia of skin: Secondary | ICD-10-CM

## 2020-11-11 DIAGNOSIS — M545 Low back pain, unspecified: Secondary | ICD-10-CM

## 2020-11-11 DIAGNOSIS — G8929 Other chronic pain: Secondary | ICD-10-CM | POA: Diagnosis not present

## 2020-11-11 IMAGING — MR MR LUMBAR SPINE W/O CM
4 of 5 series · 26 of 48 positions shown · non-contrast
Comparison: None.

CLINICAL DATA: Low back pain with numbness and tingling of the left
lower extremity.

Chronic low back pain without sciatica, unspecified back pain
laterality M54.50, [PV] ([PV]-CM)Low back pain, increased
fracture riskParesthesia of left leg [PV] ([PV]-CM)Foot drop,
left [PV] ([PV]-CM)
EXAM:
MRI LUMBAR SPINE WITHOUT CONTRAST
TECHNIQUE: Multiplanar, multisequence MR imaging of the lumbar spine was
performed. No intravenous contrast was administered.

[Series 2: T2 · sagittal · 4.0mm · 0.81mm/px · 6 of 15 slices shown (1 of 2)]
[im 1/15]
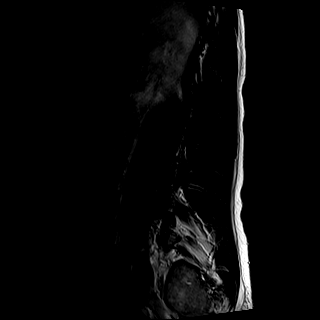
[im 3/15]
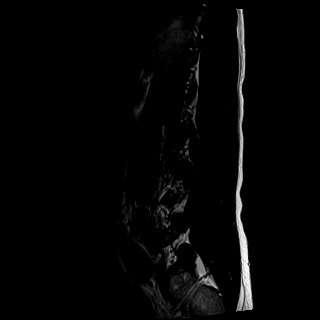
[im 6/15]
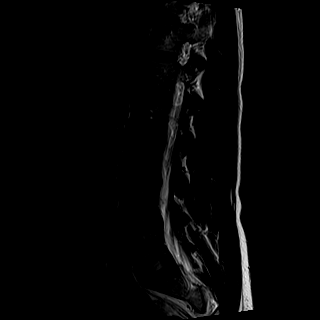
[im 9/15]
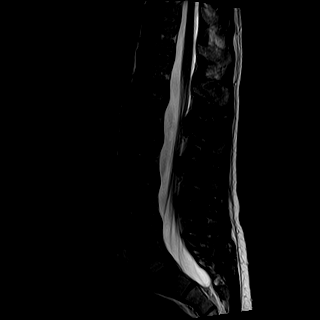
[im 12/15]
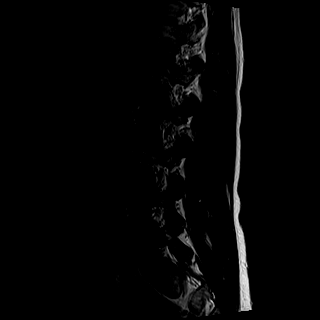
[im 15/15]
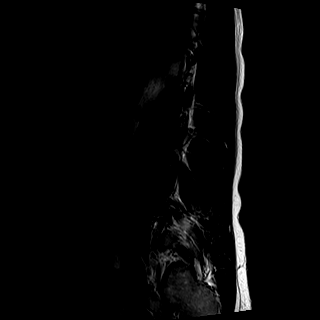

[Series 3: T1 · sagittal · 4.0mm · 0.41mm/px · 6 of 15 slices shown (1 of 2)]
[im 1/15]
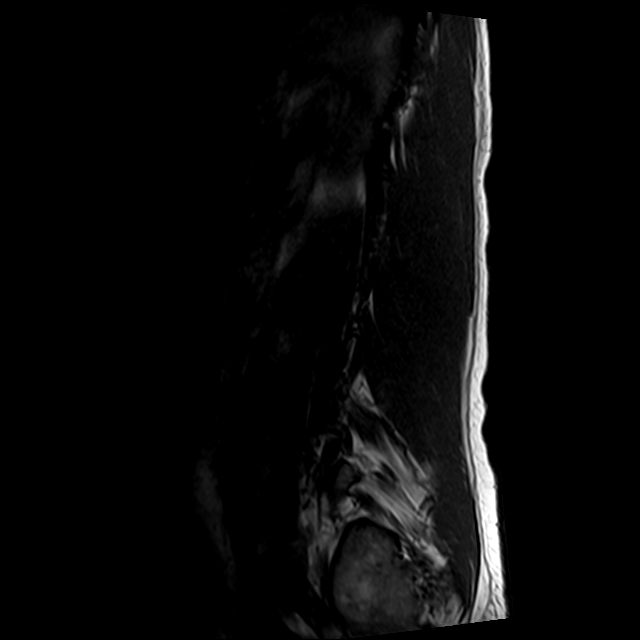
[im 3/15]
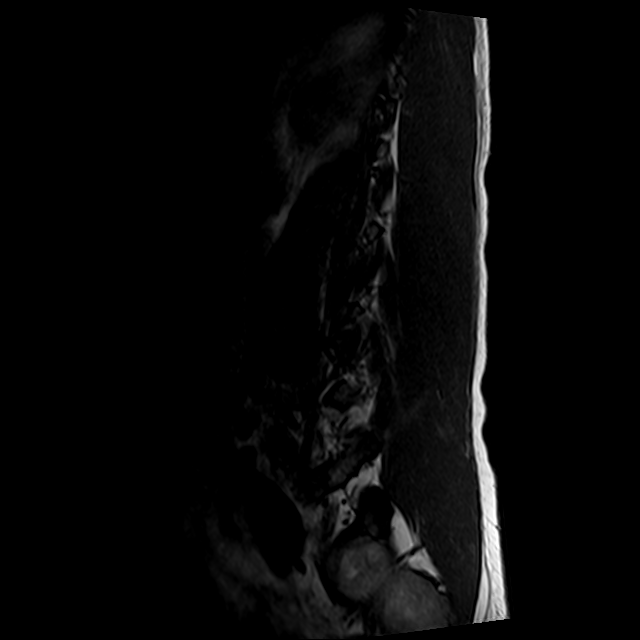
[im 6/15]
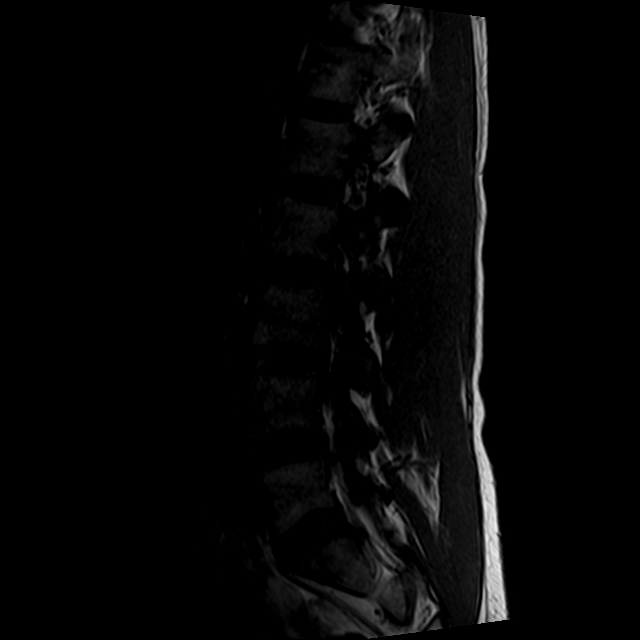
[im 9/15]
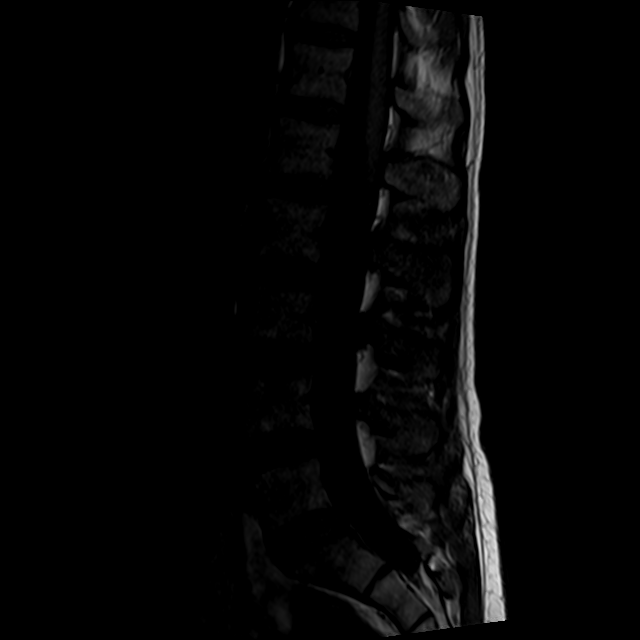
[im 12/15]
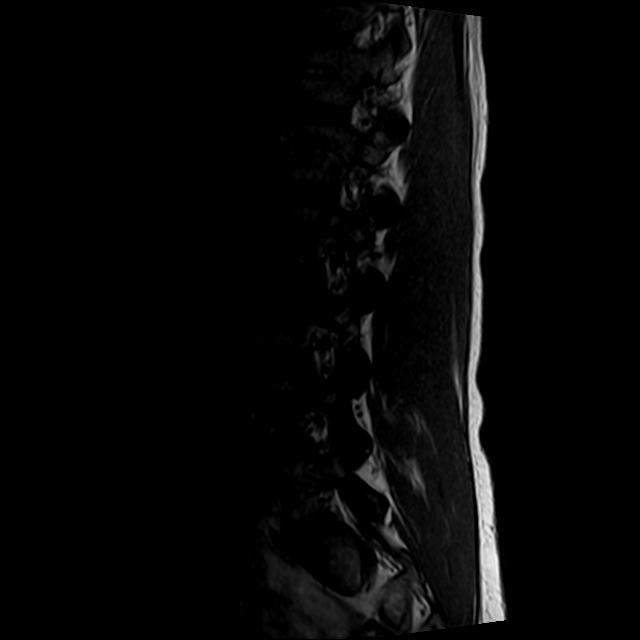
[im 15/15]
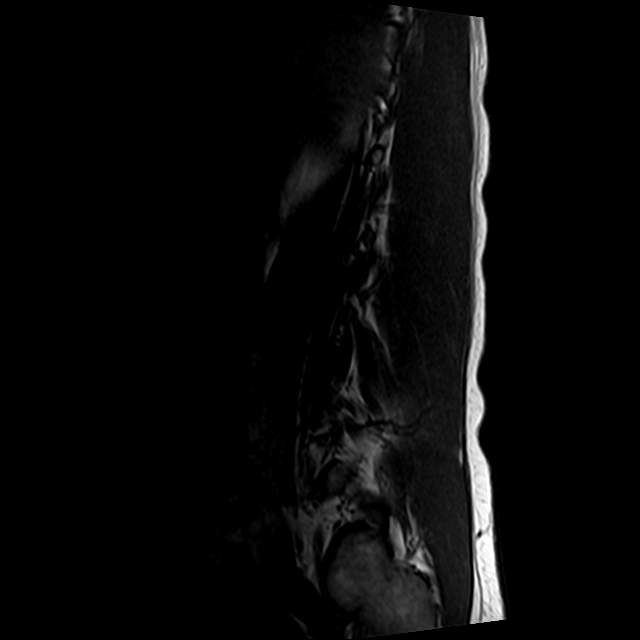

[Series 5: T2 · axial · 4.0mm · 0.78mm/px · z∈[-156,+63]mm · 9 of 40 slices shown (2 of 2)]
[im 1/40]
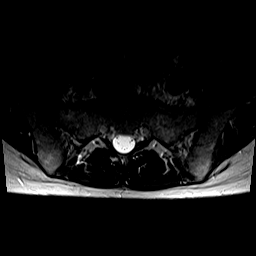
[im 6/40]
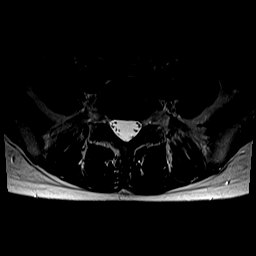
[im 12/40]
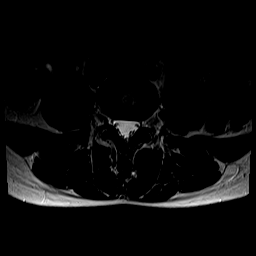
[im 17/40]
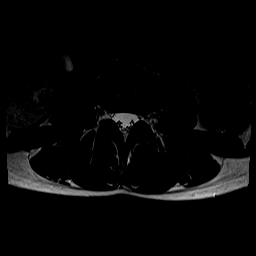
[im 20/40]
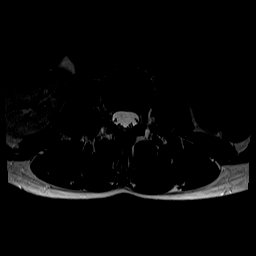
[im 23/40]
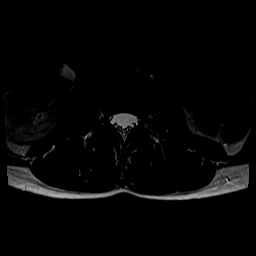
[im 28/40]
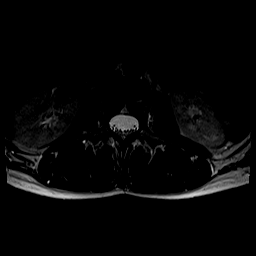
[im 34/40]
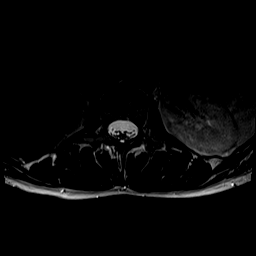
[im 40/40]
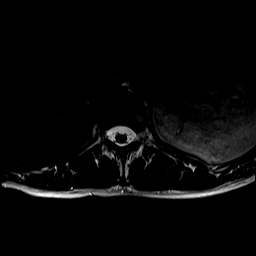

[Series 6: T1 · axial · 4.0mm · 0.39mm/px · z∈[-156,+33]mm · 5 of 40 slices shown (2 of 2)]
[im 1/40]
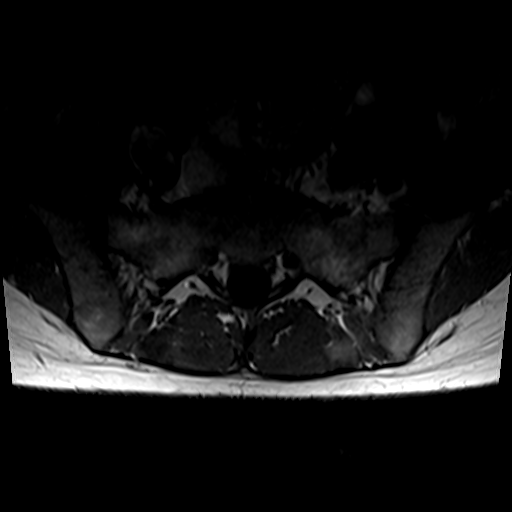
[im 6/40]
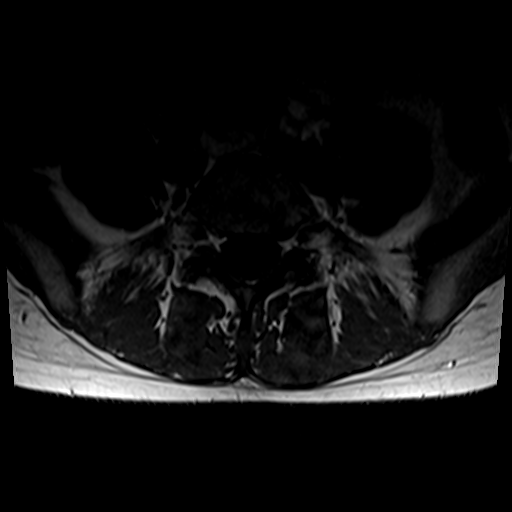
[im 12/40]
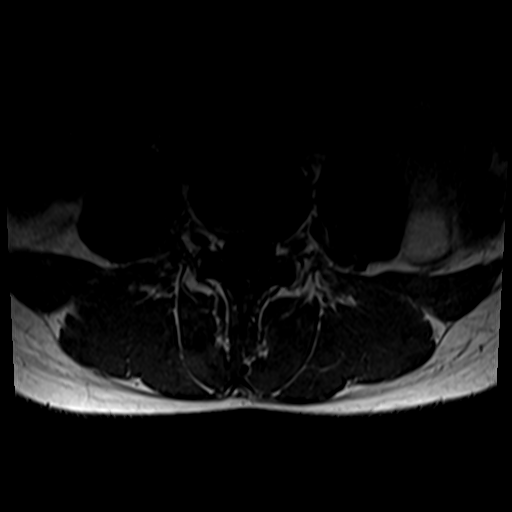
[im 20/40]
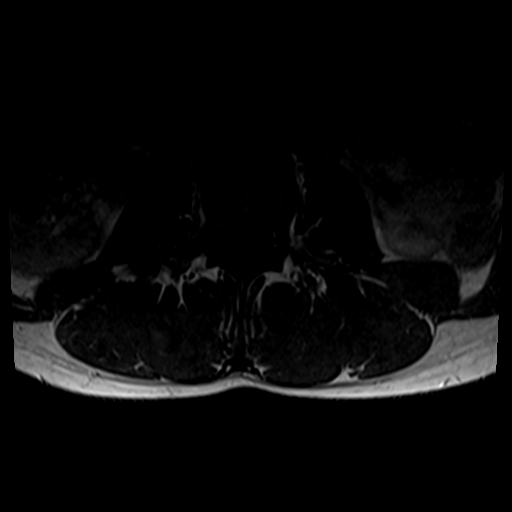
[im 34/40]
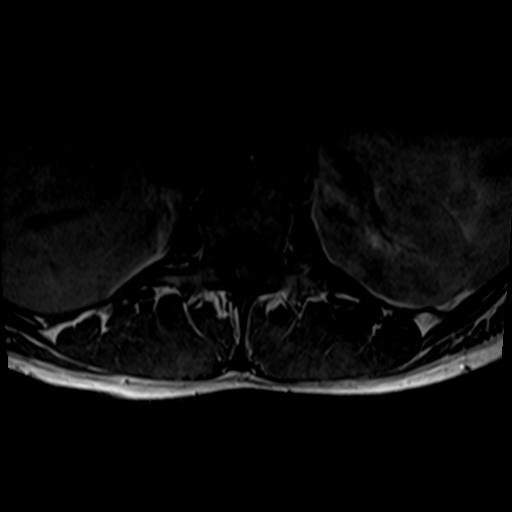

[26 of 48 positions shown; findings below may reference images not displayed]

FINDINGS: Segmentation:  Standard.

Alignment:  Physiologic.

Vertebrae:  No fracture, evidence of discitis, or bone lesion.

Conus medullaris and cauda equina: Conus extends to the L1 level.
Conus and cauda equina appear normal.

Paraspinal and other soft tissues: Negative.

Disc levels:

No disc herniation, spinal canal stenosis or neural foraminal
stenosis.
IMPRESSION: Normal lumbar spine MRI.

## 2020-11-12 NOTE — Progress Notes (Signed)
Lumbar MRI is normal appearing.  This indicates that the source of your neurologic symptoms in your leg is probably more peripheral.  Nerve conduction study will hopefully be helpful.  Recheck after the nerve conduction study has been done.

## 2020-11-12 NOTE — Progress Notes (Signed)
X-ray lumbar spine looks normal to radiology

## 2020-11-14 DIAGNOSIS — J3089 Other allergic rhinitis: Secondary | ICD-10-CM | POA: Diagnosis not present

## 2020-11-14 DIAGNOSIS — J3081 Allergic rhinitis due to animal (cat) (dog) hair and dander: Secondary | ICD-10-CM | POA: Diagnosis not present

## 2020-11-14 DIAGNOSIS — J301 Allergic rhinitis due to pollen: Secondary | ICD-10-CM | POA: Diagnosis not present

## 2020-11-20 ENCOUNTER — Other Ambulatory Visit: Payer: Self-pay

## 2020-11-20 ENCOUNTER — Ambulatory Visit: Payer: BC Managed Care – PPO | Admitting: Neurology

## 2020-11-20 DIAGNOSIS — M545 Low back pain, unspecified: Secondary | ICD-10-CM

## 2020-11-20 DIAGNOSIS — R202 Paresthesia of skin: Secondary | ICD-10-CM

## 2020-11-20 DIAGNOSIS — J3081 Allergic rhinitis due to animal (cat) (dog) hair and dander: Secondary | ICD-10-CM | POA: Diagnosis not present

## 2020-11-20 DIAGNOSIS — J3089 Other allergic rhinitis: Secondary | ICD-10-CM | POA: Diagnosis not present

## 2020-11-20 DIAGNOSIS — M21372 Foot drop, left foot: Secondary | ICD-10-CM

## 2020-11-20 DIAGNOSIS — J301 Allergic rhinitis due to pollen: Secondary | ICD-10-CM | POA: Diagnosis not present

## 2020-11-20 DIAGNOSIS — G8929 Other chronic pain: Secondary | ICD-10-CM

## 2020-11-20 NOTE — Procedures (Addendum)
[facility]  [place], [address]  [place], [place] [address] Tel: [phone] Fax:  [phone] Test Date:  11/20/2020  Patient: Heather Peters DOB: 10-Dec-1985 Physician: Narda Amber, DO  Sex: Female Height: '5\' 7"'$  Ref Phys: Lynne Leader, MD  ID#: [id number]   Technician:    Patient Complaints: This is a 35 year old female referred for evaluation of left leg paresthesias.  NCV & EMG Findings: Extensive electrodiagnostic testing of the left lower extremity and additional studies of the right shows: Left sural and bilateral superficial peroneal sensory responses are within normal limits. Left tibial and bilateral peroneal motor responses are within normal limits. Left H reflex study is within normal limits There is no evidence of active or chronic motor axonal loss changes affecting any of the tested muscles.  Motor unit configuration and recruitment pattern is within normal limits.  Impression: This is a normal study of the left lower extremity.  In particular, there is no evidence of a peroneal mononeuropathy or lumbosacral radiculopathy.   ___________________________ Narda Amber, DO    Nerve Conduction Studies Anti Sensory Summary Table   Stim Site NR Peak (ms) Norm Peak (ms) P-T Amp (V) Norm P-T Amp  Left Sup Peroneal Anti Sensory (Ant Lat Mall)  33C  12 cm    2.0 <4.5 16.9 >5  Right Sup Peroneal Anti Sensory (Ant Lat Mall)  33C  12 cm    2.0 <4.5 19.5 >5  Left Sural Anti Sensory (Lat Mall)  33C  Calf    2.6 <4.5 27.3 >5   Motor Summary Table   Stim Site NR Onset (ms) Norm Onset (ms) O-P Amp (mV) Norm O-P Amp Site1 Site2 Delta-0 (ms) Dist (cm) Vel (m/s) Norm Vel (m/s)  Left Peroneal Motor (Ext Dig Brev)  33C  Ankle    3.4 <5.5 5.8 >3 B Fib Ankle 7.9 38.0 48 >40  B Fib    11.3  5.5  Poplt B Fib 1.4 8.0 57 >40  Poplt    12.7  5.3         Right Peroneal Motor (Ext Dig Brev)  33C  Ankle    3.4 <5.5 7.7 >3 B Fib Ankle 7.0 38.0 54 >40  B Fib    10.4   7.6  Poplt B Fib 1.4 8.0 57 >40  Poplt    11.8  7.0         Left Peroneal TA Motor (Tib Ant)  33C  Fib Head    3.0 <4.0 4.8 >4 Poplit Fib Head 0.8 7.0 88 >40  Poplit    3.8  4.7         Right Peroneal TA Motor (Tib Ant)  33C  Fib Head    3.2 <4.0 5.3 >4 Poplit Fib Head 0.9 8.0 89 >40  Poplit    4.1  4.9         Left Tibial Motor (Abd Hall Brev)  33C  Ankle    4.4 <6.0 10.5 >8 Knee Ankle 8.6 40.0 47 >40  Knee    13.0  6.6          H Reflex Studies   NR H-Lat (ms) Lat Norm (ms) L-R H-Lat (ms)  Left Tibial (Gastroc)  33C     31.97 <35    EMG   Side Muscle Ins Act Fibs Psw Fasc Number Recrt Dur Dur. Amp Amp. Poly Poly. Comment  Left AntTibialis Nml Nml Nml Nml Nml Nml Nml Nml Nml Nml Nml Nml N/A  Left Gastroc Nml Nml Nml Nml Nml Nml Nml Nml Nml Nml Nml Nml N/A  Left Flex Dig Long Nml Nml Nml Nml Nml Nml Nml Nml Nml Nml Nml Nml N/A  Left RectFemoris Nml Nml Nml Nml Nml Nml Nml Nml Nml Nml Nml Nml N/A  Left GluteusMed Nml Nml Nml Nml Nml Nml Nml Nml Nml Nml Nml Nml N/A  Left Fibularis Long Nml Nml Nml Nml Nml Nml Nml Nml Nml Nml Nml Nml N/A  Left BicepsFemS Nml Nml Nml Nml Nml Nml Nml Nml Nml Nml Nml Nml N/A      Waveforms:

## 2020-11-21 ENCOUNTER — Encounter: Payer: Self-pay | Admitting: Family Medicine

## 2020-11-21 DIAGNOSIS — R202 Paresthesia of skin: Secondary | ICD-10-CM

## 2020-11-21 NOTE — Progress Notes (Signed)
Nerve Conduction study is normal (at least at rest)

## 2020-11-23 ENCOUNTER — Encounter: Payer: Self-pay | Admitting: Family Medicine

## 2020-11-26 DIAGNOSIS — J3089 Other allergic rhinitis: Secondary | ICD-10-CM | POA: Diagnosis not present

## 2020-11-26 DIAGNOSIS — J3081 Allergic rhinitis due to animal (cat) (dog) hair and dander: Secondary | ICD-10-CM | POA: Diagnosis not present

## 2020-11-26 DIAGNOSIS — J301 Allergic rhinitis due to pollen: Secondary | ICD-10-CM | POA: Diagnosis not present

## 2020-12-07 DIAGNOSIS — T79A22A Traumatic compartment syndrome of left lower extremity, initial encounter: Secondary | ICD-10-CM | POA: Diagnosis not present

## 2020-12-10 DIAGNOSIS — J3081 Allergic rhinitis due to animal (cat) (dog) hair and dander: Secondary | ICD-10-CM | POA: Diagnosis not present

## 2020-12-10 DIAGNOSIS — J301 Allergic rhinitis due to pollen: Secondary | ICD-10-CM | POA: Diagnosis not present

## 2020-12-10 DIAGNOSIS — J3089 Other allergic rhinitis: Secondary | ICD-10-CM | POA: Diagnosis not present

## 2020-12-11 DIAGNOSIS — S8412XA Injury of peroneal nerve at lower leg level, left leg, initial encounter: Secondary | ICD-10-CM | POA: Diagnosis not present

## 2020-12-12 DIAGNOSIS — J3089 Other allergic rhinitis: Secondary | ICD-10-CM | POA: Diagnosis not present

## 2020-12-12 DIAGNOSIS — J3081 Allergic rhinitis due to animal (cat) (dog) hair and dander: Secondary | ICD-10-CM | POA: Diagnosis not present

## 2020-12-12 DIAGNOSIS — J301 Allergic rhinitis due to pollen: Secondary | ICD-10-CM | POA: Diagnosis not present

## 2020-12-17 DIAGNOSIS — J3081 Allergic rhinitis due to animal (cat) (dog) hair and dander: Secondary | ICD-10-CM | POA: Diagnosis not present

## 2020-12-17 DIAGNOSIS — J301 Allergic rhinitis due to pollen: Secondary | ICD-10-CM | POA: Diagnosis not present

## 2020-12-17 DIAGNOSIS — J3089 Other allergic rhinitis: Secondary | ICD-10-CM | POA: Diagnosis not present

## 2020-12-18 ENCOUNTER — Encounter: Payer: BC Managed Care – PPO | Admitting: Neurology

## 2020-12-20 DIAGNOSIS — J3081 Allergic rhinitis due to animal (cat) (dog) hair and dander: Secondary | ICD-10-CM | POA: Diagnosis not present

## 2020-12-20 DIAGNOSIS — J301 Allergic rhinitis due to pollen: Secondary | ICD-10-CM | POA: Diagnosis not present

## 2020-12-20 DIAGNOSIS — J3089 Other allergic rhinitis: Secondary | ICD-10-CM | POA: Diagnosis not present

## 2020-12-25 DIAGNOSIS — S8412XD Injury of peroneal nerve at lower leg level, left leg, subsequent encounter: Secondary | ICD-10-CM | POA: Diagnosis not present

## 2020-12-27 DIAGNOSIS — D2272 Melanocytic nevi of left lower limb, including hip: Secondary | ICD-10-CM | POA: Diagnosis not present

## 2020-12-27 DIAGNOSIS — D224 Melanocytic nevi of scalp and neck: Secondary | ICD-10-CM | POA: Diagnosis not present

## 2020-12-27 DIAGNOSIS — J3089 Other allergic rhinitis: Secondary | ICD-10-CM | POA: Diagnosis not present

## 2020-12-27 DIAGNOSIS — L821 Other seborrheic keratosis: Secondary | ICD-10-CM | POA: Diagnosis not present

## 2020-12-27 DIAGNOSIS — L814 Other melanin hyperpigmentation: Secondary | ICD-10-CM | POA: Diagnosis not present

## 2020-12-27 DIAGNOSIS — D225 Melanocytic nevi of trunk: Secondary | ICD-10-CM | POA: Diagnosis not present

## 2020-12-27 DIAGNOSIS — J3081 Allergic rhinitis due to animal (cat) (dog) hair and dander: Secondary | ICD-10-CM | POA: Diagnosis not present

## 2020-12-27 DIAGNOSIS — D2271 Melanocytic nevi of right lower limb, including hip: Secondary | ICD-10-CM | POA: Diagnosis not present

## 2020-12-27 DIAGNOSIS — J301 Allergic rhinitis due to pollen: Secondary | ICD-10-CM | POA: Diagnosis not present

## 2020-12-27 DIAGNOSIS — Z872 Personal history of diseases of the skin and subcutaneous tissue: Secondary | ICD-10-CM | POA: Diagnosis not present

## 2021-01-02 ENCOUNTER — Other Ambulatory Visit: Payer: Self-pay

## 2021-01-02 ENCOUNTER — Encounter (HOSPITAL_BASED_OUTPATIENT_CLINIC_OR_DEPARTMENT_OTHER): Payer: Self-pay | Admitting: Orthopaedic Surgery
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
## 2021-01-08 NOTE — Progress Notes (Signed)

## 2021-01-09 NOTE — Discharge Instructions (Addendum)
Ophelia Charter MD, MPH [facility] [address] [phone] (tel)   [phone] (fax)   POST-OPERATIVE INSTRUCTIONS - LOWER EXTREMITY   WOUND CARE Please keep splint clean dry and intact until followup.  You may shower on Post-Op Day #2.  You must keep splint dry during this process and may find that a plastic bag taped around the leg or alternatively a towel based bath may be a better option.   If you get your splint wet or if it is damaged please contact our clinic.  EXERCISES Due to your splint being in place you will not be able to bear weight through your extremity.   DO NOT PUT ANY WEIGHT ON YOUR OPERATIVE LEG Please use crutches or a walker to avoid weight bearing.   REGIONAL ANESTHESIA (NERVE BLOCKS) The anesthesia team may have performed a nerve block for you if safe in the setting of your care.  This is a great tool used to minimize pain.  Typically the block may start wearing off overnight but the long acting medicine may last for 3-4 days.  The nerve block wearing off can be a challenging period but please utilize your as needed pain medications to try and manage this period.    POST-OP MEDICATIONS- Multimodal approach to pain control  In general your pain will be controlled with a combination of substances.  Prescriptions unless otherwise discussed are electronically sent to your pharmacy.  This is a carefully made plan we use to minimize narcotic use.      - Meloxicam - Anti-inflammatory medication taken on a scheduled basis  - Acetaminophen - Non-narcotic pain medicine taken on a scheduled basis   - Gabapentin - this is a medication to help with pain, take on a scheduled basis  - Oxycodone - This is a strong narcotic, to be used only on an "as needed" basis for SEVERE pain.  -  Aspirin 81mg  - This medicine is used to minimize the risk of blood clots after surgery.             -          Zofran - take as needed for nausea    FOLLOW-UP If you develop a Fever (>101.5), Redness or Drainage from the surgical incision site, please call our office to arrange for an evaluation. Please call the office to schedule a follow-up appointment for your incision check if you do not already have one, 7-10 days post-operatively.  IF YOU HAVE ANY QUESTIONS, PLEASE FEEL FREE TO CALL OUR OFFICE.  HELPFUL INFORMATION  If you had a block, it will wear off between 8-24 hrs postop typically.  This is period when your pain may go from nearly zero to the pain you would have had postop without the block.  This is an abrupt transition but nothing dangerous is happening.  You may take an extra dose of narcotic when this happens.  You should wean off your narcotic medicines as soon as you are able.  Most patients will be off or using minimal narcotics before their first postop appointment.   We suggest you use the pain medication the first night prior to going to bed, in order to ease any pain when the anesthesia wears off. You should avoid taking pain medications on an empty stomach as it will make you nauseous.  Do not drink alcoholic beverages or take illicit drugs when taking pain medications.  In most states it is against the law to  drive while you are in a splint or sling.  And certainly against the law to drive while taking narcotics.  You may return to work/school in the next couple of days when you feel up to it.   Pain medication may make you constipated.  Below are a few solutions to try in this order: Decrease the amount of pain medication if you aren't having pain. Drink lots of decaffeinated fluids. Drink prune juice and/or each dried prunes  If the first 3 don't work start with additional solutions Take Colace - an over-the-counter stool softener Take Senokot - an over-the-counter laxative Take Miralax - a stronger over-the-counter laxative  For more information including helpful videos and documents visit our website:    [email or web address]    Post Anesthesia Home Care Instructions  Activity: Get plenty of rest for the remainder of the day. A responsible individual must stay with you for 24 hours following the procedure.  For the next 24 hours, DO NOT: -Drive a car -Paediatric nurse -Drink alcoholic beverages -Take any medication unless instructed by your physician -Make any legal decisions or sign important papers.  Meals: Start with liquid foods such as gelatin or soup. Progress to regular foods as tolerated. Avoid greasy, spicy, heavy foods. If nausea and/or vomiting occur, drink only clear liquids until the nausea and/or vomiting subsides. Call your physician if vomiting continues.  Special Instructions/Symptoms: Your throat may feel dry or sore from the anesthesia or the breathing tube placed in your throat during surgery. If this causes discomfort, gargle with warm salt water. The discomfort should disappear within 24 hours.  If you had a scopolamine patch placed behind your ear for the management of post- operative nausea and/or vomiting:  1. The medication in the patch is effective for 72 hours, after which it should be removed.  Wrap patch in a tissue and discard in the trash. Wash hands thoroughly with soap and water. 2. You may remove the patch earlier than 72 hours if you experience unpleasant side effects which may include dry mouth, dizziness or visual disturbances. 3. Avoid touching the patch. Wash your hands with soap and water after contact with the patch.    Next dose of NSAIDs after 2pm as needed for pain.

## 2021-01-10 ENCOUNTER — Other Ambulatory Visit: Payer: Self-pay

## 2021-01-10 ENCOUNTER — Ambulatory Visit (HOSPITAL_BASED_OUTPATIENT_CLINIC_OR_DEPARTMENT_OTHER): Payer: BC Managed Care – PPO | Admitting: Certified Registered"

## 2021-01-10 ENCOUNTER — Ambulatory Visit (HOSPITAL_BASED_OUTPATIENT_CLINIC_OR_DEPARTMENT_OTHER)
Admission: RE | Admit: 2021-01-10 | Discharge: 2021-01-10 | Disposition: A | Discharge status: Home / Self Care | Payer: BC Managed Care – PPO | Attending: Orthopaedic Surgery | Admitting: Orthopaedic Surgery

## 2021-01-10 ENCOUNTER — Encounter (HOSPITAL_BASED_OUTPATIENT_CLINIC_OR_DEPARTMENT_OTHER): Payer: Self-pay | Admitting: Orthopaedic Surgery

## 2021-01-10 ENCOUNTER — Encounter (HOSPITAL_BASED_OUTPATIENT_CLINIC_OR_DEPARTMENT_OTHER)
Admission: RE | Disposition: A | Discharge status: Home / Self Care | Payer: Self-pay | Source: Home / Self Care | Attending: Orthopaedic Surgery

## 2021-01-10 DIAGNOSIS — Z85820 Personal history of malignant melanoma of skin: Secondary | ICD-10-CM | POA: Diagnosis not present

## 2021-01-10 DIAGNOSIS — J309 Allergic rhinitis, unspecified: Secondary | ICD-10-CM | POA: Diagnosis not present

## 2021-01-10 DIAGNOSIS — S8412XA Injury of peroneal nerve at lower leg level, left leg, initial encounter: Secondary | ICD-10-CM | POA: Diagnosis not present

## 2021-01-10 DIAGNOSIS — T79A12A Traumatic compartment syndrome of left upper extremity, initial encounter: Secondary | ICD-10-CM | POA: Diagnosis not present

## 2021-01-10 DIAGNOSIS — E039 Hypothyroidism, unspecified: Secondary | ICD-10-CM | POA: Diagnosis not present

## 2021-01-10 DIAGNOSIS — Y9355 Activity, bike riding: Secondary | ICD-10-CM | POA: Diagnosis not present

## 2021-01-10 DIAGNOSIS — T79A22A Traumatic compartment syndrome of left lower extremity, initial encounter: Secondary | ICD-10-CM | POA: Insufficient documentation

## 2021-01-10 DIAGNOSIS — G5732 Lesion of lateral popliteal nerve, left lower limb: Secondary | ICD-10-CM | POA: Insufficient documentation

## 2021-01-10 HISTORY — PX: SUPERFICIAL PERONEAL NERVE RELEASE: SHX6200

## 2021-01-10 HISTORY — DX: Other specified postprocedural states: R11.2

## 2021-01-10 HISTORY — DX: Other specified postprocedural states: Z98.890

## 2021-01-10 LAB — POCT PREGNANCY, URINE: Preg Test, Ur: NEGATIVE

## 2021-01-10 SURGERY — DECOMPRESSION, NERVE, SUPERFICIAL PERONEAL
Anesthesia: General | Site: Leg Lower | Laterality: Left

## 2021-01-10 MED ORDER — DEXAMETHASONE SODIUM PHOSPHATE 10 MG/ML IJ SOLN
INTRAMUSCULAR | Status: DC | PRN
Start: 2021-01-10 — End: 2021-01-10
  Administered 2021-01-10: 10 mg via INTRAVENOUS

## 2021-01-10 MED ORDER — PROPOFOL 10 MG/ML IV BOLUS
INTRAVENOUS | Status: DC | PRN
  Administered 2021-01-10: 200 mg via INTRAVENOUS

## 2021-01-10 MED ORDER — MIDAZOLAM HCL 5 MG/5ML IJ SOLN
INTRAMUSCULAR | Status: DC | PRN
  Administered 2021-01-10: 2 mg via INTRAVENOUS

## 2021-01-10 MED ORDER — CEFAZOLIN SODIUM-DEXTROSE 2-4 GM/100ML-% IV SOLN
INTRAVENOUS | Status: AC
  Filled 2021-01-10: qty 100

## 2021-01-10 MED ORDER — GABAPENTIN 100 MG PO CAPS: 100.0000 mg | ORAL_CAPSULE | Freq: Three times a day (TID) | ORAL | 0 refills | Status: AC

## 2021-01-10 MED ORDER — KETOROLAC TROMETHAMINE 30 MG/ML IJ SOLN
INTRAMUSCULAR | Status: AC
  Filled 2021-01-10: qty 1

## 2021-01-10 MED ORDER — LIDOCAINE HCL (CARDIAC) PF 100 MG/5ML IV SOSY
PREFILLED_SYRINGE | INTRAVENOUS | Status: DC | PRN
  Administered 2021-01-10: 60 mg via INTRAVENOUS

## 2021-01-10 MED ORDER — FENTANYL CITRATE (PF) 100 MCG/2ML IJ SOLN
INTRAMUSCULAR | Status: AC
  Filled 2021-01-10: qty 2

## 2021-01-10 MED ORDER — BUPIVACAINE HCL (PF) 0.25 % IJ SOLN
INTRAMUSCULAR | Status: DC | PRN
  Administered 2021-01-10: 30 mL

## 2021-01-10 MED ORDER — MELOXICAM 15 MG PO TABS: 15.0000 mg | ORAL_TABLET | Freq: Every day | ORAL | 0 refills | Status: DC

## 2021-01-10 MED ORDER — KETOROLAC TROMETHAMINE 30 MG/ML IJ SOLN
INTRAMUSCULAR | Status: DC | PRN
  Administered 2021-01-10: 30 mg via INTRAVENOUS

## 2021-01-10 MED ORDER — OXYCODONE HCL 5 MG/5ML PO SOLN: 5.0000 mg | Freq: Once | ORAL | Status: DC | PRN

## 2021-01-10 MED ORDER — ASPIRIN 81 MG PO CHEW: 81.0000 mg | CHEWABLE_TABLET | Freq: Two times a day (BID) | ORAL | 0 refills | Status: AC

## 2021-01-10 MED ORDER — CEFAZOLIN SODIUM-DEXTROSE 2-4 GM/100ML-% IV SOLN
2.0000 g | INTRAVENOUS | Status: AC
  Administered 2021-01-10: 2 g via INTRAVENOUS

## 2021-01-10 MED ORDER — ONDANSETRON HCL 4 MG/2ML IJ SOLN
INTRAMUSCULAR | Status: DC | PRN
Start: 2021-01-10 — End: 2021-01-10
  Administered 2021-01-10: 4 mg via INTRAVENOUS

## 2021-01-10 MED ORDER — ONDANSETRON HCL 4 MG/2ML IJ SOLN
INTRAMUSCULAR | Status: AC
  Filled 2021-01-10: qty 2

## 2021-01-10 MED ORDER — PROPOFOL 10 MG/ML IV BOLUS
INTRAVENOUS | Status: AC
  Filled 2021-01-10: qty 40

## 2021-01-10 MED ORDER — DEXAMETHASONE SODIUM PHOSPHATE 10 MG/ML IJ SOLN
INTRAMUSCULAR | Status: AC
  Filled 2021-01-10: qty 1

## 2021-01-10 MED ORDER — DROPERIDOL 2.5 MG/ML IJ SOLN
INTRAMUSCULAR | Status: DC | PRN
  Administered 2021-01-10: .625 mg via INTRAVENOUS

## 2021-01-10 MED ORDER — DROPERIDOL 2.5 MG/ML IJ SOLN
INTRAMUSCULAR | Status: AC
  Filled 2021-01-10: qty 2

## 2021-01-10 MED ORDER — LACTATED RINGERS IV SOLN: INTRAVENOUS | Status: DC

## 2021-01-10 MED ORDER — OXYCODONE HCL 5 MG PO TABS
5.0000 mg | ORAL_TABLET | Freq: Once | ORAL | Status: DC | PRN
Start: 2021-01-10 — End: 2021-01-10

## 2021-01-10 MED ORDER — MEPERIDINE HCL 25 MG/ML IJ SOLN: 6.2500 mg | INTRAMUSCULAR | Status: DC | PRN

## 2021-01-10 MED ORDER — ACETAMINOPHEN 500 MG PO TABS: 1000.0000 mg | ORAL_TABLET | Freq: Three times a day (TID) | ORAL | 0 refills | Status: AC

## 2021-01-10 MED ORDER — OXYCODONE HCL 5 MG PO TABS: ORAL_TABLET | ORAL | 0 refills | Status: AC

## 2021-01-10 MED ORDER — VANCOMYCIN HCL 1 G IV SOLR
INTRAVENOUS | Status: DC | PRN
  Administered 2021-01-10: 1000 mg

## 2021-01-10 MED ORDER — VANCOMYCIN HCL 1000 MG IV SOLR
INTRAVENOUS | Status: AC
  Filled 2021-01-10: qty 20

## 2021-01-10 MED ORDER — MIDAZOLAM HCL 2 MG/2ML IJ SOLN
INTRAMUSCULAR | Status: AC
  Filled 2021-01-10: qty 2

## 2021-01-10 MED ORDER — PROMETHAZINE HCL 25 MG/ML IJ SOLN: 6.2500 mg | INTRAMUSCULAR | Status: DC | PRN

## 2021-01-10 MED ORDER — HYDROMORPHONE HCL 1 MG/ML IJ SOLN: 0.2500 mg | INTRAMUSCULAR | Status: DC | PRN

## 2021-01-10 MED ORDER — BUPIVACAINE HCL (PF) 0.25 % IJ SOLN
INTRAMUSCULAR | Status: AC
  Filled 2021-01-10: qty 30

## 2021-01-10 MED ORDER — LIDOCAINE 2% (20 MG/ML) 5 ML SYRINGE
INTRAMUSCULAR | Status: AC
  Filled 2021-01-10: qty 5

## 2021-01-10 MED ORDER — ONDANSETRON HCL 4 MG PO TABS: 4.0000 mg | ORAL_TABLET | Freq: Three times a day (TID) | ORAL | 0 refills | Status: AC | PRN

## 2021-01-10 MED ORDER — FENTANYL CITRATE (PF) 100 MCG/2ML IJ SOLN
INTRAMUSCULAR | Status: DC | PRN
  Administered 2021-01-10: 25 ug via INTRAVENOUS
  Administered 2021-01-10: 50 ug via INTRAVENOUS

## 2021-01-10 SURGICAL SUPPLY — 72 items
BANDAGE ESMARK 6X9 LF (GAUZE/BANDAGES/DRESSINGS) ×1 IMPLANT
BENZOIN TINCTURE PRP APPL 2/3 (GAUZE/BANDAGES/DRESSINGS) IMPLANT
BLADE HEX COATED 2.75 (ELECTRODE) IMPLANT
BLADE SURG 10 STRL SS (BLADE) ×2 IMPLANT
BLADE SURG 15 STRL LF DISP TIS (BLADE) ×1 IMPLANT
BLADE SURG 15 STRL SS (BLADE) ×1
BNDG ELASTIC 4X5.8 VLCR STR LF (GAUZE/BANDAGES/DRESSINGS) ×4 IMPLANT
BNDG ELASTIC 6X5.8 VLCR STR LF (GAUZE/BANDAGES/DRESSINGS) ×2 IMPLANT
BNDG ESMARK 6X9 LF (GAUZE/BANDAGES/DRESSINGS) ×2
BNDG GAUZE ELAST 4 BULKY (GAUZE/BANDAGES/DRESSINGS) IMPLANT
CANISTER SUCT 1200ML W/VALVE (MISCELLANEOUS) IMPLANT
CHLORAPREP W/TINT 26 (MISCELLANEOUS) ×2 IMPLANT
CLSR STERI-STRIP ANTIMIC 1/2X4 (GAUZE/BANDAGES/DRESSINGS) ×4 IMPLANT
CUFF TOURN SGL QUICK 34 (TOURNIQUET CUFF)
CUFF TRNQT CYL 34X4.125X (TOURNIQUET CUFF) IMPLANT
DECANTER SPIKE VIAL GLASS SM (MISCELLANEOUS) IMPLANT
DRAIN PENROSE 1/2X12 LTX STRL (WOUND CARE) ×2 IMPLANT
DRAPE EXTREMITY T 121X128X90 (DISPOSABLE) ×2 IMPLANT
DRAPE IMP U-DRAPE 54X76 (DRAPES) IMPLANT
DRAPE INCISE IOBAN 66X45 STRL (DRAPES) IMPLANT
DRAPE U-SHAPE 47X51 STRL (DRAPES) ×2 IMPLANT
DRSG MEPILEX BORDER 4X8 (GAUZE/BANDAGES/DRESSINGS) IMPLANT
DRSG PAD ABDOMINAL 8X10 ST (GAUZE/BANDAGES/DRESSINGS) IMPLANT
ELECT REM PT RETURN 9FT ADLT (ELECTROSURGICAL) ×2
ELECTRODE REM PT RTRN 9FT ADLT (ELECTROSURGICAL) ×1 IMPLANT
FIBER TAPE 2MM (SUTURE) IMPLANT
GAUZE SPONGE 4X4 12PLY STRL (GAUZE/BANDAGES/DRESSINGS) ×2 IMPLANT
GAUZE XEROFORM 1X8 LF (GAUZE/BANDAGES/DRESSINGS) IMPLANT
GLOVE SRG 8 PF TXTR STRL LF DI (GLOVE) ×1 IMPLANT
GLOVE SURG ENC MOIS LTX SZ6.5 (GLOVE) ×4 IMPLANT
GLOVE SURG LTX SZ8 (GLOVE) ×2 IMPLANT
GLOVE SURG UNDER POLY LF SZ6.5 (GLOVE) ×2 IMPLANT
GLOVE SURG UNDER POLY LF SZ7 (GLOVE) ×2 IMPLANT
GLOVE SURG UNDER POLY LF SZ8 (GLOVE) ×1
GOWN STRL REUS W/ TWL LRG LVL3 (GOWN DISPOSABLE) ×1 IMPLANT
GOWN STRL REUS W/TWL LRG LVL3 (GOWN DISPOSABLE) ×1
GOWN STRL REUS W/TWL XL LVL3 (GOWN DISPOSABLE) ×2 IMPLANT
IMMOBILIZER KNEE 22 UNIV (SOFTGOODS) IMPLANT
MANIFOLD NEPTUNE II (INSTRUMENTS) ×2 IMPLANT
NDL SUT 6 .5 CRC .975X.05 MAYO (NEEDLE) IMPLANT
NEEDLE MAYO TAPER (NEEDLE)
NS IRRIG 1000ML POUR BTL (IV SOLUTION) ×2 IMPLANT
PACK ARTHROSCOPY DSU (CUSTOM PROCEDURE TRAY) ×2 IMPLANT
PACK BASIN DAY SURGERY FS (CUSTOM PROCEDURE TRAY) ×2 IMPLANT
PAD CAST 4YDX4 CTTN HI CHSV (CAST SUPPLIES) ×1 IMPLANT
PADDING CAST ABS 4INX4YD NS (CAST SUPPLIES) ×1
PADDING CAST ABS 6INX4YD NS (CAST SUPPLIES) ×1
PADDING CAST ABS COTTON 4X4 ST (CAST SUPPLIES) ×1 IMPLANT
PADDING CAST ABS COTTON 6X4 NS (CAST SUPPLIES) ×1 IMPLANT
PADDING CAST COTTON 4X4 STRL (CAST SUPPLIES) ×1
PADDING CAST COTTON 6X4 STRL (CAST SUPPLIES) ×2 IMPLANT
PENCIL SMOKE EVACUATOR (MISCELLANEOUS) ×2 IMPLANT
SLEEVE SCD COMPRESS KNEE MED (STOCKING) ×2 IMPLANT
SPLINT FAST PLASTER 5X30 (CAST SUPPLIES) ×20
SPLINT PLASTER CAST FAST 5X30 (CAST SUPPLIES) ×20 IMPLANT
SPONGE T-LAP 18X18 ~~LOC~~+RFID (SPONGE) ×2 IMPLANT
SUCTION FRAZIER HANDLE 10FR (MISCELLANEOUS)
SUCTION TUBE FRAZIER 10FR DISP (MISCELLANEOUS) IMPLANT
SUT MNCRL AB 3-0 PS2 18 (SUTURE) IMPLANT
SUT MNCRL AB 4-0 PS2 18 (SUTURE) ×4 IMPLANT
SUT VIC AB 0 CT1 18XCR BRD 8 (SUTURE) IMPLANT
SUT VIC AB 0 CT1 27 (SUTURE) ×1
SUT VIC AB 0 CT1 27XBRD ANBCTR (SUTURE) ×1 IMPLANT
SUT VIC AB 0 CT1 8-18 (SUTURE)
SUT VIC AB 1 CT1 27 (SUTURE)
SUT VIC AB 1 CT1 27XBRD ANBCTR (SUTURE) IMPLANT
SUT VIC AB 3-0 SH 27 (SUTURE) ×2
SUT VIC AB 3-0 SH 27X BRD (SUTURE) ×2 IMPLANT
SYR BULB EAR ULCER 3OZ GRN STR (SYRINGE) ×2 IMPLANT
TOWEL GREEN STERILE FF (TOWEL DISPOSABLE) ×4 IMPLANT
TUBE SUCTION HIGH CAP CLEAR NV (SUCTIONS) ×2 IMPLANT
YANKAUER SUCT BULB TIP NO VENT (SUCTIONS) IMPLANT

## 2021-01-10 NOTE — Anesthesia Procedure Notes (Signed)
Procedure Name: LMA Insertion Date/Time: 01/10/2021 7:23 AM Performed by: Lavonia Dana, CRNA Pre-anesthesia Checklist: Patient identified, Emergency Drugs available, Suction available and Patient being monitored Patient Re-evaluated:Patient Re-evaluated prior to induction Oxygen Delivery Method: Circle system utilized Preoxygenation: Pre-oxygenation with 100% oxygen Induction Type: IV induction Ventilation: Mask ventilation without difficulty LMA: LMA inserted LMA Size: 4.0 Number of attempts: 1 Airway Equipment and Method: Bite block Placement Confirmation: positive ETCO2 Tube secured with: Tape Dental Injury: Teeth and Oropharynx as per pre-operative assessment

## 2021-01-10 NOTE — Anesthesia Preprocedure Evaluation (Signed)
Anesthesia Evaluation  Patient identified by MRN, date of birth, ID band Patient awake    Reviewed: Allergy & Precautions, NPO status , Patient's Chart, lab work & pertinent test results  History of Anesthesia Complications (+) PONV  Airway Mallampati: II  TM Distance: >3 FB Neck ROM: Full    Dental no notable dental hx.    Pulmonary neg pulmonary ROS,    Pulmonary exam normal breath sounds clear to auscultation       Cardiovascular negative cardio ROS Normal cardiovascular exam Rhythm:Regular Rate:Normal     Neuro/Psych negative neurological ROS  negative psych ROS   GI/Hepatic negative GI ROS, Neg liver ROS,   Endo/Other  negative endocrine ROS  Renal/GU negative Renal ROS  negative genitourinary   Musculoskeletal negative musculoskeletal ROS (+)   Abdominal   Peds negative pediatric ROS (+)  Hematology negative hematology ROS (+)   Anesthesia Other Findings   Reproductive/Obstetrics negative OB ROS                             Anesthesia Physical Anesthesia Plan  ASA: 1  Anesthesia Plan: General   Post-op Pain Management:    Induction: Intravenous  PONV Risk Score and Plan: 4 or greater and Ondansetron, Dexamethasone, Midazolam, Droperidol and Treatment may vary due to age or medical condition  Airway Management Planned: LMA  Additional Equipment:   Intra-op Plan:   Post-operative Plan: Extubation in OR  Informed Consent: I have reviewed the patients History and Physical, chart, labs and discussed the procedure including the risks, benefits and alternatives for the proposed anesthesia with the patient or authorized representative who has indicated his/her understanding and acceptance.     Dental advisory given  Plan Discussed with: CRNA  Anesthesia Plan Comments:         Anesthesia Quick Evaluation

## 2021-01-10 NOTE — Transfer of Care (Signed)
Immediate Anesthesia Transfer of Care Note  Patient: Heather Peters  Procedure(s) Performed: PERONEAL  NERVE DECOMPRESSION LEFT LOWER EXTREMITY, ANTERIOR AND LATERAL COMPARTMENT RELEASE (Left: Leg Lower)  Patient Location: PACU  Anesthesia Type:General  Level of Consciousness: awake, alert  and oriented  Airway & Oxygen Therapy: Patient Spontanous Breathing and Patient connected to face mask oxygen  Post-op Assessment: Report given to RN and Post -op Vital signs reviewed and stable  Post vital signs: Reviewed and stable  Last Vitals:  Vitals Value Taken Time  BP 112/69 01/10/21 0830  Temp    Pulse 76 01/10/21 0831  Resp 12 01/10/21 0830  SpO2 100 % 01/10/21 0831  Vitals shown include unvalidated device data.  Last Pain:  Vitals:   01/10/21 0633  TempSrc: Oral  PainSc: 3          Complications: No notable events documented.

## 2021-01-10 NOTE — Interval H&P Note (Signed)
All questions answered, patient wants to proceed with procedure. ? ?

## 2021-01-10 NOTE — Op Note (Signed)
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  Patient's peroneal nerve was quite scarred down at the fibular head likely a result of her previous trauma.  We had a full release from the biceps femoris past the fibular head identifying the 3 branches.  The nerve had no tension at the end of the case.   The distal incision and release of the anterior lateral compartments was routine.  We are able to check to verify that our scissors had released fully from our proximal incision.  Post-operative plan: The patient will be nonweightbearing in the splint for about a week with range of motion of the knee to tolerance.  The patient will be discharged home.  DVT prophylaxis Aspirin 81 mg twice daily for 6 weeks.   Pain control with PRN pain medication preferring oral medicines.  Follow up plan will be scheduled in approximately 7 days for incision check.  Post-Op Diagnosis: Same Surgeons:Primary: Hiram Gash, MD Assistants:Caroline McBane PA-C Location: [place] OR ROOM 6 Anesthesia: General with local anesthesia Antibiotics: Ancef 2 g with local vancomycin powder 1 g at the surgical site Tourniquet time:  Total Tourniquet Time Documented: Thigh (Left) - 37 minutes Total: Thigh (Left) - 37 minutes  Estimated Blood Loss: Minimal Complications: None Specimens: None Implants: * No implants in log *  Indications for Surgery:   Heather Peters is a 35 y.o. female with trauma to the lateral left knee resulting in continued pain with improvement in symptoms with a Hydro dissection of the peroneal nerve.  Additionally the patient had borderline high  measurements in the anterior compartment with resting exertional compartment testing.  She measured 17 in the interim department.  Benefits and risks of operative and nonoperative management were discussed prior to surgery with patient/guardian(s) and informed consent form was completed.  Specific risks including infection, need for additional surgery, neurovascular injury, continued pain, stiffness amongst others.   Procedure:   The patient was identified properly. Informed consent was obtained and the surgical site was marked. The patient was taken up to suite where general anesthesia was induced.  The patient was positioned supine on a regular bed.  The left leg was prepped and draped in the usual sterile fashion.  Timeout was performed before the beginning of the case.  Tourniquet was used for the above duration.  We began by identifying the fibular head as well as Gertie's tubercle and the lateral condyle.  We marked an incision in Gertie safe zone just posterior to where we would normally place an incision for a posterior lateral corner.  At that point we were able to go through skin sharp achieving hemostasis progressed.  We preserve the lateral cutaneous branch of the nerve.  We then dissected down to the fascia and from proximal and distal identify the peroneal nerve as it exited behind biceps femoris and carefully traced it with blunt scissors down to the fibular head.  We released the fascia around the fibular head as well as the deep fascia/crural fascia identifying the branches of the peroneal nerve past the fibular head distally.  Once this release was complete we ensured there were no bands of tissue that were tensioning the nerve and the nerve was  fully released and released throughout its course.  We then turned to a distal incision for exertional compartment syndrome release.  We centered a 4 cm incision 11 cm from the tip of the fibula centered over the intermuscular septum.  We then  were able to dissect down to the fascia bluntly achieving hemostasis we progressed.  We identified the superficial peroneal nerve and release it as it exited the anterior compartment.  We then made subfascial and suprafascial planes both proximal and distal and used a long Metzenbaum scissors to release the anterior lateral compartments without issue take care to protect her nerve with direct visualization throughout.  We had good release the compartments confirmed from the proximal incision.  Nerve was intact.  We irrigated the wound copiously before placing local antibiotic as listed above.  We closed the incision in a multilayer fashion with absorbable suture.  Sterile dressing was placed.  Well molded well-padded short leg splint was placed.  Patient was awoken taken to PACU in stable condition.  Merlene Pulling, PA-C, present and scrubbed throughout the case, critical for completion in a timely fashion, and for retraction, instrumentation, closure.

## 2021-01-10 NOTE — Anesthesia Postprocedure Evaluation (Signed)
Anesthesia Post Note  Patient: Psychologist, educational  Procedure(s) Performed: PERONEAL  NERVE DECOMPRESSION LEFT LOWER EXTREMITY, ANTERIOR AND LATERAL COMPARTMENT RELEASE (Left: Leg Lower)     Patient location during evaluation: PACU Anesthesia Type: General Level of consciousness: awake and alert Pain management: pain level controlled Vital Signs Assessment: post-procedure vital signs reviewed and stable Respiratory status: spontaneous breathing, nonlabored ventilation and respiratory function stable Cardiovascular status: blood pressure returned to baseline and stable Postop Assessment: no apparent nausea or vomiting Anesthetic complications: no   No notable events documented.  Last Vitals:  Vitals:   01/10/21 0900 01/10/21 0929  BP: 111/74 117/73  Pulse: 63 (!) 57  Resp: 19 16  Temp:  36.8 C  SpO2: 98%     Last Pain:  Vitals:   01/10/21 0929  TempSrc: Oral  PainSc: 2                  Lynda Rainwater

## 2021-01-11 ENCOUNTER — Encounter (HOSPITAL_BASED_OUTPATIENT_CLINIC_OR_DEPARTMENT_OTHER): Payer: Self-pay | Admitting: Orthopaedic Surgery

## 2021-01-17 DIAGNOSIS — S8412XA Injury of peroneal nerve at lower leg level, left leg, initial encounter: Secondary | ICD-10-CM | POA: Diagnosis not present

## 2021-01-21 DIAGNOSIS — J301 Allergic rhinitis due to pollen: Secondary | ICD-10-CM | POA: Diagnosis not present

## 2021-01-21 DIAGNOSIS — J3081 Allergic rhinitis due to animal (cat) (dog) hair and dander: Secondary | ICD-10-CM | POA: Diagnosis not present

## 2021-01-21 DIAGNOSIS — J3089 Other allergic rhinitis: Secondary | ICD-10-CM | POA: Diagnosis not present

## 2021-01-30 DIAGNOSIS — M25672 Stiffness of left ankle, not elsewhere classified: Secondary | ICD-10-CM | POA: Diagnosis not present

## 2021-01-30 DIAGNOSIS — J3089 Other allergic rhinitis: Secondary | ICD-10-CM | POA: Diagnosis not present

## 2021-01-30 DIAGNOSIS — T79A22D Traumatic compartment syndrome of left lower extremity, subsequent encounter: Secondary | ICD-10-CM | POA: Diagnosis not present

## 2021-01-30 DIAGNOSIS — R262 Difficulty in walking, not elsewhere classified: Secondary | ICD-10-CM | POA: Diagnosis not present

## 2021-01-30 DIAGNOSIS — J3081 Allergic rhinitis due to animal (cat) (dog) hair and dander: Secondary | ICD-10-CM | POA: Diagnosis not present

## 2021-01-30 DIAGNOSIS — J301 Allergic rhinitis due to pollen: Secondary | ICD-10-CM | POA: Diagnosis not present

## 2021-01-30 DIAGNOSIS — M6281 Muscle weakness (generalized): Secondary | ICD-10-CM | POA: Diagnosis not present

## 2021-02-05 DIAGNOSIS — M7631 Iliotibial band syndrome, right leg: Secondary | ICD-10-CM | POA: Diagnosis not present

## 2021-02-07 ENCOUNTER — Other Ambulatory Visit (HOSPITAL_COMMUNITY): Payer: Self-pay | Admitting: Family Medicine

## 2021-02-07 ENCOUNTER — Other Ambulatory Visit: Payer: Self-pay | Admitting: Family Medicine

## 2021-02-07 DIAGNOSIS — N926 Irregular menstruation, unspecified: Secondary | ICD-10-CM | POA: Diagnosis not present

## 2021-02-07 DIAGNOSIS — Z79899 Other long term (current) drug therapy: Secondary | ICD-10-CM | POA: Diagnosis not present

## 2021-02-07 DIAGNOSIS — R591 Generalized enlarged lymph nodes: Secondary | ICD-10-CM

## 2021-02-11 ENCOUNTER — Other Ambulatory Visit (HOSPITAL_COMMUNITY): Payer: Self-pay | Admitting: Family Medicine

## 2021-02-11 DIAGNOSIS — E041 Nontoxic single thyroid nodule: Secondary | ICD-10-CM

## 2021-02-11 DIAGNOSIS — J3081 Allergic rhinitis due to animal (cat) (dog) hair and dander: Secondary | ICD-10-CM | POA: Diagnosis not present

## 2021-02-11 DIAGNOSIS — R591 Generalized enlarged lymph nodes: Secondary | ICD-10-CM

## 2021-02-11 DIAGNOSIS — J301 Allergic rhinitis due to pollen: Secondary | ICD-10-CM | POA: Diagnosis not present

## 2021-02-11 DIAGNOSIS — J3089 Other allergic rhinitis: Secondary | ICD-10-CM | POA: Diagnosis not present

## 2021-02-13 DIAGNOSIS — R262 Difficulty in walking, not elsewhere classified: Secondary | ICD-10-CM | POA: Diagnosis not present

## 2021-02-13 DIAGNOSIS — M6281 Muscle weakness (generalized): Secondary | ICD-10-CM | POA: Diagnosis not present

## 2021-02-13 DIAGNOSIS — M25672 Stiffness of left ankle, not elsewhere classified: Secondary | ICD-10-CM | POA: Diagnosis not present

## 2021-02-13 DIAGNOSIS — T79A22D Traumatic compartment syndrome of left lower extremity, subsequent encounter: Secondary | ICD-10-CM | POA: Diagnosis not present

## 2021-02-15 ENCOUNTER — Other Ambulatory Visit: Payer: Self-pay

## 2021-02-15 ENCOUNTER — Ambulatory Visit (HOSPITAL_BASED_OUTPATIENT_CLINIC_OR_DEPARTMENT_OTHER)
Admission: RE | Admit: 2021-02-15 | Discharge: 2021-02-15 | Disposition: A | Discharge status: Home / Self Care | Payer: BC Managed Care – PPO | Source: Ambulatory Visit | Attending: Family Medicine | Admitting: Family Medicine

## 2021-02-15 DIAGNOSIS — R591 Generalized enlarged lymph nodes: Secondary | ICD-10-CM | POA: Insufficient documentation

## 2021-02-15 DIAGNOSIS — E041 Nontoxic single thyroid nodule: Secondary | ICD-10-CM | POA: Diagnosis not present

## 2021-02-15 DIAGNOSIS — R590 Localized enlarged lymph nodes: Secondary | ICD-10-CM | POA: Diagnosis not present

## 2021-02-15 IMAGING — US US THYROID
1 series · 13 of 25 positions shown · non-contrast
Comparison: Neck CT [DATE], ultrasound [DATE]

CLINICAL DATA: 35-year-old female with a history of
hemithyroidectomy

EXAM:
THYROID ULTRASOUND
TECHNIQUE: Ultrasound examination of the thyroid gland and adjacent soft
tissues was performed.

[Series 1: us thyroid · 13 of 61 slices shown]
[im 1/61]
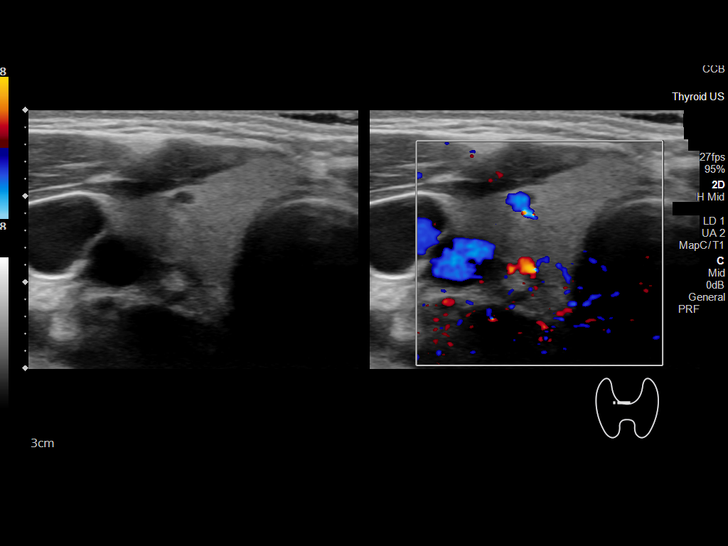
[im 6/61]
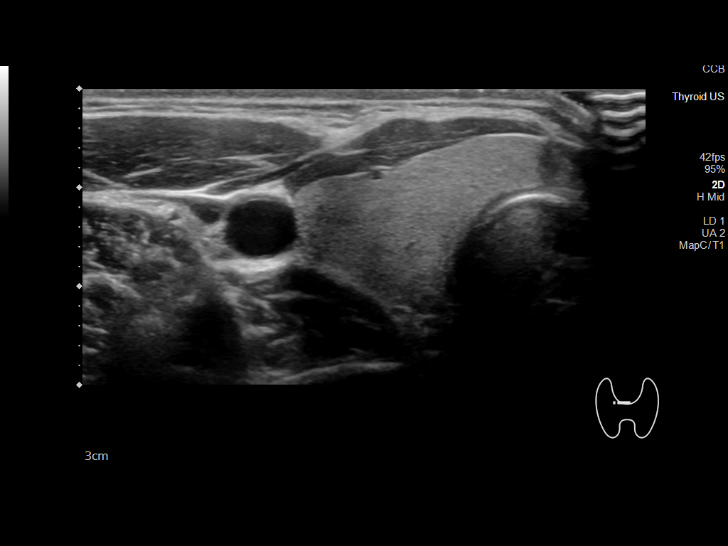
[im 11/61]
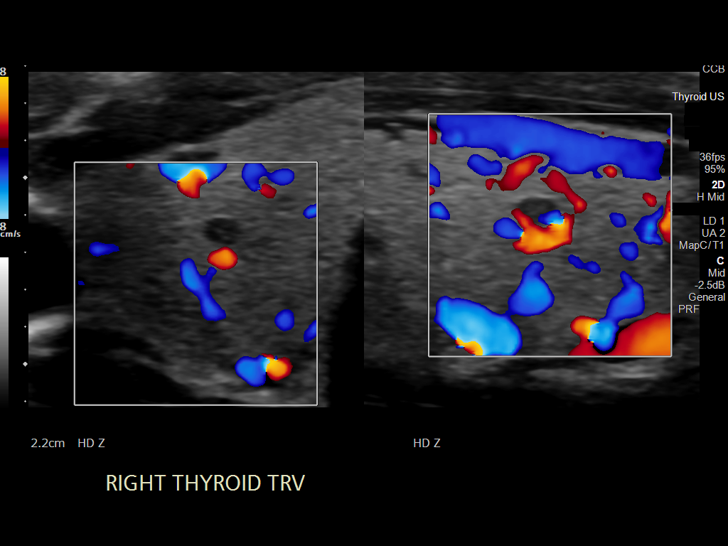
[im 16/61]
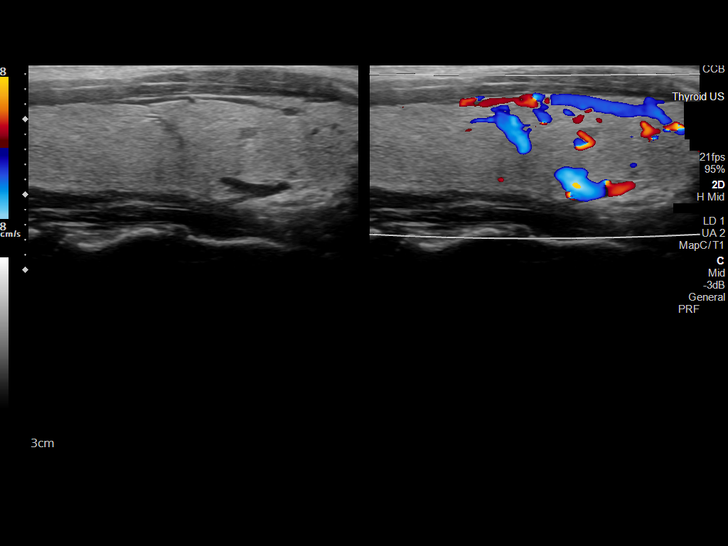
[im 21/61]
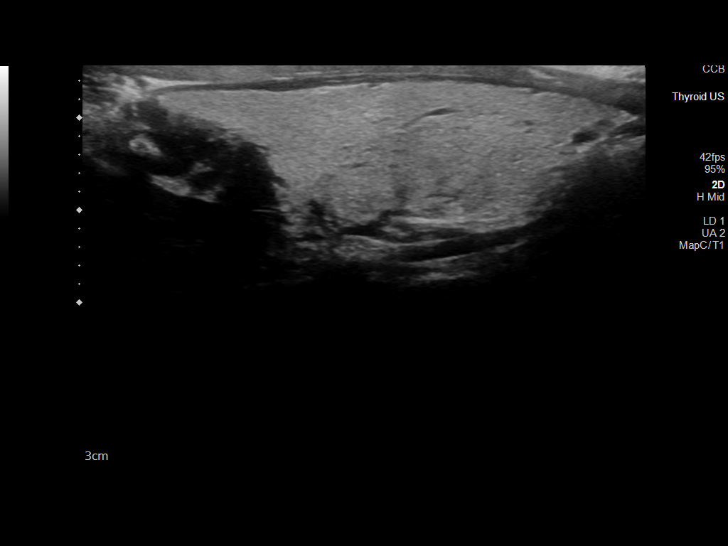
[im 26/61]
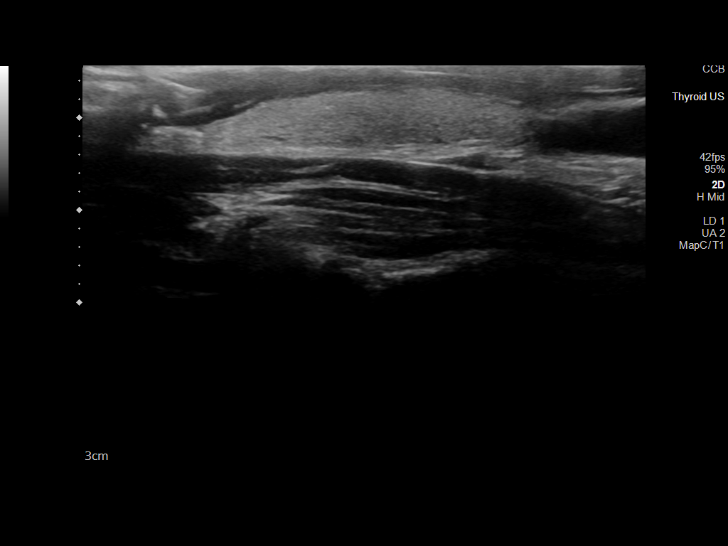
[im 31/61]
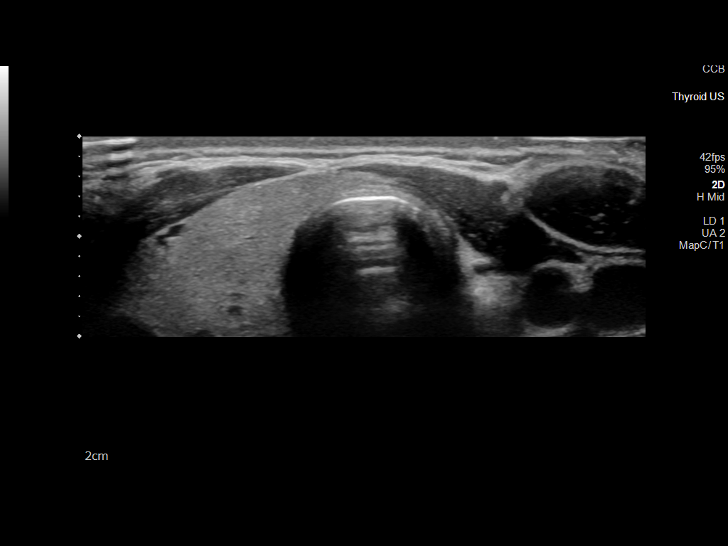
[im 36/61]
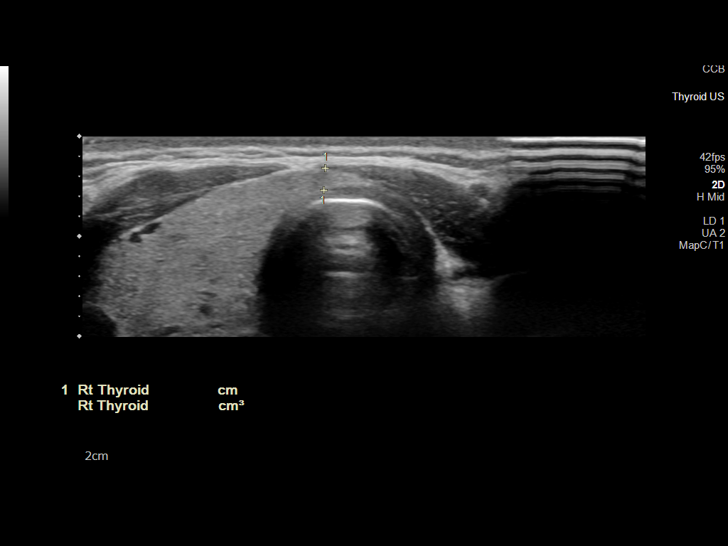
[im 41/61]
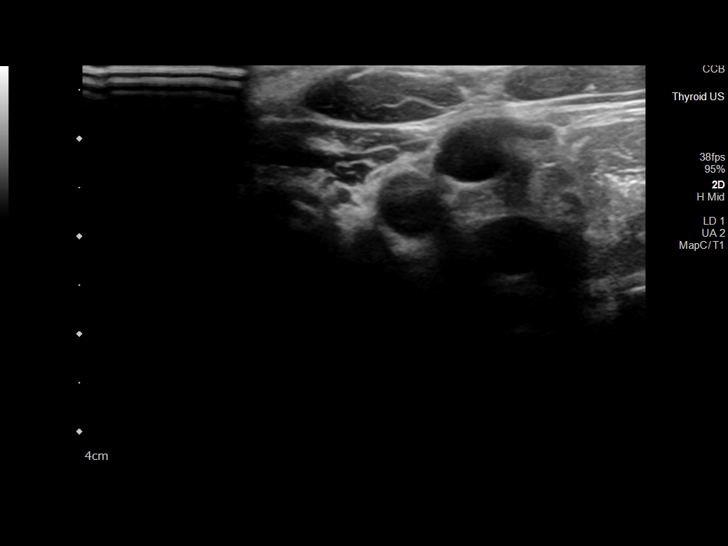
[im 46/61]
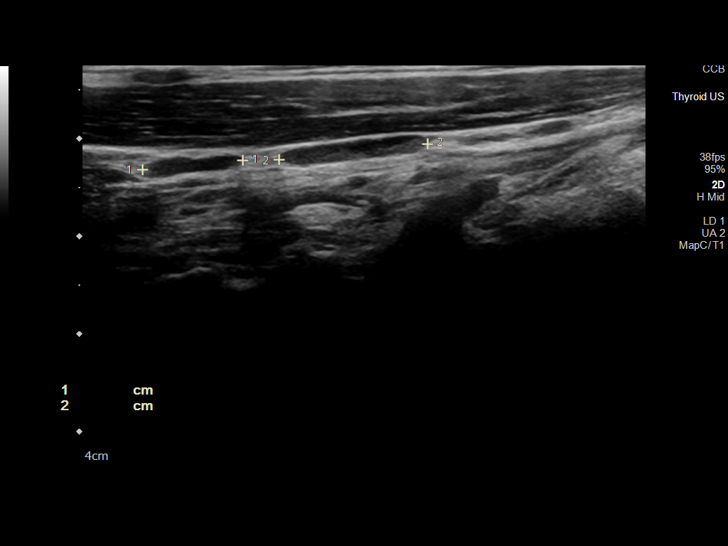
[im 51/61]
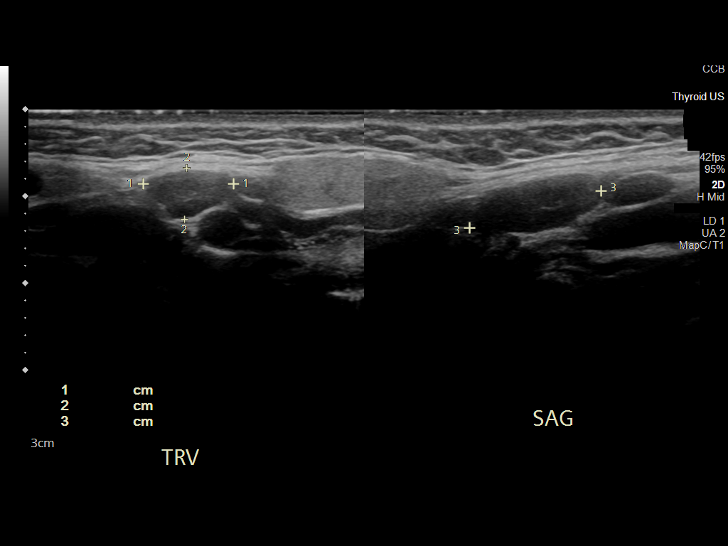
[im 56/61]
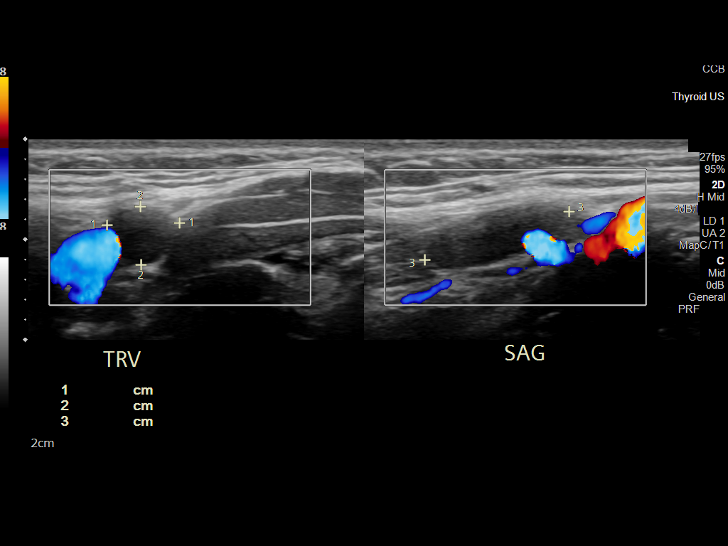
[im 61/61]
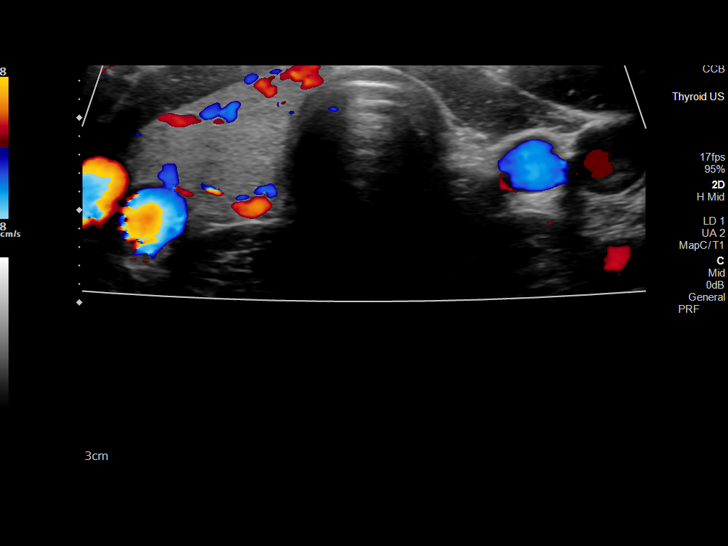

[13 of 25 positions shown; findings below may reference images not displayed]

FINDINGS: Parenchymal Echotexture: Normal

Isthmus: 0.2 cm

Right lobe: 5.8 cm x 1.6 cm x 1.9 cm

Left lobe: Left hemithyroidectomy

_________________________________________________________

Estimated total number of nodules >/= 1 cm: 0

Number of spongiform nodules >/=  2 cm not described below (TR1): 0

Number of mixed cystic and solid nodules >/= 1.5 cm not described
below (TR2): 0

_________________________________________________________

Small rounded hypodense cystic changes of the right thyroid, none of
which meet criteria for surveillance.

Surgical changes of left hemithyroidectomy, with unremarkable
appearance of the surgical thyroid bed.

Lymph node in the right cervical nodal stations with typical
architecture, not enlarged.

Recommendations follow those established by the new ACR TI-RADS
criteria ([HOSPITAL] [81];[DATE]).
IMPRESSION: Surgical changes of left hemithyroidectomy with unremarkable
appearance of the surgical thyroid bed.

Typical appearing right-sided cervical lymph node, nonspecific,
though potentially reactive.

## 2021-02-18 DIAGNOSIS — J3081 Allergic rhinitis due to animal (cat) (dog) hair and dander: Secondary | ICD-10-CM | POA: Diagnosis not present

## 2021-02-18 DIAGNOSIS — J3089 Other allergic rhinitis: Secondary | ICD-10-CM | POA: Diagnosis not present

## 2021-02-18 DIAGNOSIS — J301 Allergic rhinitis due to pollen: Secondary | ICD-10-CM | POA: Diagnosis not present

## 2021-02-19 DIAGNOSIS — R262 Difficulty in walking, not elsewhere classified: Secondary | ICD-10-CM | POA: Diagnosis not present

## 2021-02-19 DIAGNOSIS — M25672 Stiffness of left ankle, not elsewhere classified: Secondary | ICD-10-CM | POA: Diagnosis not present

## 2021-02-19 DIAGNOSIS — M6281 Muscle weakness (generalized): Secondary | ICD-10-CM | POA: Diagnosis not present

## 2021-02-19 DIAGNOSIS — T79A22D Traumatic compartment syndrome of left lower extremity, subsequent encounter: Secondary | ICD-10-CM | POA: Diagnosis not present

## 2021-03-01 DIAGNOSIS — J3089 Other allergic rhinitis: Secondary | ICD-10-CM | POA: Diagnosis not present

## 2021-03-01 DIAGNOSIS — J301 Allergic rhinitis due to pollen: Secondary | ICD-10-CM | POA: Diagnosis not present

## 2021-03-01 DIAGNOSIS — J3081 Allergic rhinitis due to animal (cat) (dog) hair and dander: Secondary | ICD-10-CM | POA: Diagnosis not present

## 2021-03-05 DIAGNOSIS — J301 Allergic rhinitis due to pollen: Secondary | ICD-10-CM | POA: Diagnosis not present

## 2021-03-05 DIAGNOSIS — J3089 Other allergic rhinitis: Secondary | ICD-10-CM | POA: Diagnosis not present

## 2021-03-05 DIAGNOSIS — J3081 Allergic rhinitis due to animal (cat) (dog) hair and dander: Secondary | ICD-10-CM | POA: Diagnosis not present

## 2021-03-05 DIAGNOSIS — H1045 Other chronic allergic conjunctivitis: Secondary | ICD-10-CM | POA: Diagnosis not present

## 2021-03-06 DIAGNOSIS — R262 Difficulty in walking, not elsewhere classified: Secondary | ICD-10-CM | POA: Diagnosis not present

## 2021-03-06 DIAGNOSIS — T79A22D Traumatic compartment syndrome of left lower extremity, subsequent encounter: Secondary | ICD-10-CM | POA: Diagnosis not present

## 2021-03-06 DIAGNOSIS — M6281 Muscle weakness (generalized): Secondary | ICD-10-CM | POA: Diagnosis not present

## 2021-03-06 DIAGNOSIS — M25672 Stiffness of left ankle, not elsewhere classified: Secondary | ICD-10-CM | POA: Diagnosis not present

## 2021-03-12 DIAGNOSIS — J3081 Allergic rhinitis due to animal (cat) (dog) hair and dander: Secondary | ICD-10-CM | POA: Diagnosis not present

## 2021-03-12 DIAGNOSIS — J3089 Other allergic rhinitis: Secondary | ICD-10-CM | POA: Diagnosis not present

## 2021-03-12 DIAGNOSIS — J301 Allergic rhinitis due to pollen: Secondary | ICD-10-CM | POA: Diagnosis not present

## 2021-03-19 DIAGNOSIS — J301 Allergic rhinitis due to pollen: Secondary | ICD-10-CM | POA: Diagnosis not present

## 2021-03-19 DIAGNOSIS — J3089 Other allergic rhinitis: Secondary | ICD-10-CM | POA: Diagnosis not present

## 2021-03-27 DIAGNOSIS — J3089 Other allergic rhinitis: Secondary | ICD-10-CM | POA: Diagnosis not present

## 2021-03-27 DIAGNOSIS — J3081 Allergic rhinitis due to animal (cat) (dog) hair and dander: Secondary | ICD-10-CM | POA: Diagnosis not present

## 2021-03-27 DIAGNOSIS — J301 Allergic rhinitis due to pollen: Secondary | ICD-10-CM | POA: Diagnosis not present

## 2021-04-05 DIAGNOSIS — J3081 Allergic rhinitis due to animal (cat) (dog) hair and dander: Secondary | ICD-10-CM | POA: Diagnosis not present

## 2021-04-05 DIAGNOSIS — J3089 Other allergic rhinitis: Secondary | ICD-10-CM | POA: Diagnosis not present

## 2021-04-05 DIAGNOSIS — J301 Allergic rhinitis due to pollen: Secondary | ICD-10-CM | POA: Diagnosis not present

## 2021-04-18 DIAGNOSIS — J3089 Other allergic rhinitis: Secondary | ICD-10-CM | POA: Diagnosis not present

## 2021-04-18 DIAGNOSIS — J3081 Allergic rhinitis due to animal (cat) (dog) hair and dander: Secondary | ICD-10-CM | POA: Diagnosis not present

## 2021-04-18 DIAGNOSIS — J301 Allergic rhinitis due to pollen: Secondary | ICD-10-CM | POA: Diagnosis not present

## 2021-04-24 DIAGNOSIS — J3089 Other allergic rhinitis: Secondary | ICD-10-CM | POA: Diagnosis not present

## 2021-04-24 DIAGNOSIS — J301 Allergic rhinitis due to pollen: Secondary | ICD-10-CM | POA: Diagnosis not present

## 2021-04-24 DIAGNOSIS — J3081 Allergic rhinitis due to animal (cat) (dog) hair and dander: Secondary | ICD-10-CM | POA: Diagnosis not present

## 2021-05-01 DIAGNOSIS — J3081 Allergic rhinitis due to animal (cat) (dog) hair and dander: Secondary | ICD-10-CM | POA: Diagnosis not present

## 2021-05-01 DIAGNOSIS — J301 Allergic rhinitis due to pollen: Secondary | ICD-10-CM | POA: Diagnosis not present

## 2021-05-01 DIAGNOSIS — J3089 Other allergic rhinitis: Secondary | ICD-10-CM | POA: Diagnosis not present

## 2021-05-02 DIAGNOSIS — E538 Deficiency of other specified B group vitamins: Secondary | ICD-10-CM | POA: Diagnosis not present

## 2021-05-02 DIAGNOSIS — E559 Vitamin D deficiency, unspecified: Secondary | ICD-10-CM | POA: Diagnosis not present

## 2021-05-02 DIAGNOSIS — Z79899 Other long term (current) drug therapy: Secondary | ICD-10-CM | POA: Diagnosis not present

## 2021-05-02 DIAGNOSIS — L659 Nonscarring hair loss, unspecified: Secondary | ICD-10-CM | POA: Diagnosis not present

## 2021-05-09 DIAGNOSIS — M25561 Pain in right knee: Secondary | ICD-10-CM | POA: Diagnosis not present

## 2021-05-13 DIAGNOSIS — M25561 Pain in right knee: Secondary | ICD-10-CM | POA: Diagnosis not present

## 2021-05-14 DIAGNOSIS — J301 Allergic rhinitis due to pollen: Secondary | ICD-10-CM | POA: Diagnosis not present

## 2021-05-14 DIAGNOSIS — J3081 Allergic rhinitis due to animal (cat) (dog) hair and dander: Secondary | ICD-10-CM | POA: Diagnosis not present

## 2021-05-14 DIAGNOSIS — J3089 Other allergic rhinitis: Secondary | ICD-10-CM | POA: Diagnosis not present

## 2021-05-16 DIAGNOSIS — J301 Allergic rhinitis due to pollen: Secondary | ICD-10-CM | POA: Diagnosis not present

## 2021-05-16 DIAGNOSIS — J3081 Allergic rhinitis due to animal (cat) (dog) hair and dander: Secondary | ICD-10-CM | POA: Diagnosis not present

## 2021-05-17 DIAGNOSIS — J3089 Other allergic rhinitis: Secondary | ICD-10-CM | POA: Diagnosis not present

## 2021-05-22 DIAGNOSIS — J3081 Allergic rhinitis due to animal (cat) (dog) hair and dander: Secondary | ICD-10-CM | POA: Diagnosis not present

## 2021-05-22 DIAGNOSIS — J301 Allergic rhinitis due to pollen: Secondary | ICD-10-CM | POA: Diagnosis not present

## 2021-05-22 DIAGNOSIS — J3089 Other allergic rhinitis: Secondary | ICD-10-CM | POA: Diagnosis not present

## 2021-05-23 DIAGNOSIS — M25561 Pain in right knee: Secondary | ICD-10-CM | POA: Diagnosis not present

## 2021-05-27 ENCOUNTER — Encounter (HOSPITAL_BASED_OUTPATIENT_CLINIC_OR_DEPARTMENT_OTHER): Payer: Self-pay | Admitting: Orthopaedic Surgery

## 2021-05-27 ENCOUNTER — Other Ambulatory Visit: Payer: Self-pay

## 2021-05-28 NOTE — H&P (Signed)
PREOPERATIVE H&P  Chief Complaint: right knee PLICA syndrome, IT band syndrome  HPI: Heather Peters is a 36 y.o. female who is scheduled for, Procedure(s): KNEE ARTHROSCOPY WITH EXCISION PLICA FASCIOTOMY, IT BAND.   Patient has a past medical history significant for PONV.   Heather Peters is a very pleasant 36 year old female who Dr. Griffin Basil did a left peroneal decompression with anterior compartment release on September 13. She did really well with this. She began having pain in the right knee. She had IT band injections and did okay with that for short period of time. She has had PRP, as well as tried therapy and braces Se feels like she gets popping pain with full extension.  More achy around the lateral joint line.  She has pain and difficulty going up and down stairs.   Symptoms are rated as moderate to severe, and have been worsening.  This is significantly impairing activities of daily living.    Please see clinic note for further details on this patient's care.    She has elected for surgical management.   Past Medical History:  Diagnosis Date   Allergy    Melanoma ([place]) 2015   PONV (postoperative nausea and vomiting)    Thyroid disease    Past Surgical History:  Procedure Laterality Date   Left knee surgery   06/19/2020   SUPERFICIAL PERONEAL NERVE RELEASE Left 01/10/2021   Procedure: PERONEAL  NERVE DECOMPRESSION LEFT LOWER EXTREMITY, ANTERIOR AND LATERAL COMPARTMENT RELEASE;  Surgeon: Hiram Gash, MD;  Location: [place] [facility];  Service: Orthopedics;  Laterality: Left;   THYROIDECTOMY, PARTIAL  2015   Social History   Socioeconomic History   Marital status: Married    Spouse name: Not on file   Number of children: Not on file   Years of education: Not on file   Highest education level: Not on file  Occupational History   Not on file  Tobacco Use   Smoking status: Never   Smokeless tobacco: Never  Vaping Use   Vaping Use: Never used  Substance and  Sexual Activity   Alcohol use: Yes   Drug use: Never   Sexual activity: Yes  Other Topics Concern   Not on file  Social History Narrative   Not on file   Social Determinants of Health   Financial Resource Strain: Not on file  Food Insecurity: Not on file  Transportation Needs: Not on file  Physical Activity: Not on file  Stress: Not on file  Social Connections: Not on file   Family History  Problem Relation Age of Onset   Hyperlipidemia Mother    Healthy Father    Healthy Sister    Healthy Sister    No Known Allergies Prior to Admission medications   Medication Sig Start Date End Date Taking? Authorizing Provider  cetirizine (ZYRTEC) 10 MG tablet Take 10 mg by mouth daily.   Yes [provider]  Cholecalciferol (D3-1000 PO) Take by mouth.   Yes [provider]  Cyanocobalamin (VITAMIN B-12) 500 MCG SUBL Place 1 tablet (500 mcg total) under the tongue daily. 02/03/20  Yes Brunetta Jeans, PA-C  UNABLE TO FIND Med Name: Allergy Shots once a week injections   Yes [provider]  meloxicam (MOBIC) 15 MG tablet Take 1 tablet (15 mg total) by mouth daily. 01/10/21   Merlene Pulling K, PA-C    ROS: All other systems have been reviewed and were otherwise negative with the exception of  those mentioned in the HPI and as above.  Physical Exam: General: Alert, no acute distress Cardiovascular: No pedal edema Respiratory: No cyanosis, no use of accessory musculature GI: No organomegaly, abdomen is soft and non-tender Skin: No lesions in the area of chief complaint Neurologic: Sensation intact distally Psychiatric: Patient is competent for consent with normal mood and affect Lymphatic: No axillary or cervical lymphadenopathy  MUSCULOSKELETAL:  Pleasant female sitting up in no acute distress.  She is able to stand and move about the room without pain or antalgia.  There is some crepitus with full extension.  There is tenderness around the lateral patellar  border and lateral joint line.  She has negative laxity for anterior drawer, posterior drawer, varus or valgus stress testing.  Mild positive meniscal signs with McMurray's and pivot shift.    Imaging: MRI right knee demonstrates IT band friction syndrome  Assessment: right knee PLICA syndrome, IT band syndrome  Plan: Plan for Procedure(s): KNEE ARTHROSCOPY WITH EXCISION PLICA FASCIOTOMY, IT BAND  The risks benefits and alternatives were discussed with the patient including but not limited to the risks of nonoperative treatment, versus surgical intervention including infection, bleeding, nerve injury,  blood clots, cardiopulmonary complications, morbidity, mortality, among others, and they were willing to proceed.   The patient acknowledged the explanation, agreed to proceed with the plan and consent was signed.   Operative Plan: Right knee scope with plica excision and IT band release Discharge Medications: Standard DVT Prophylaxis: Aspirin Physical Therapy: Outpatient PT Special Discharge needs: +/-   Ethelda Chick, PA-C  05/28/2021 7:29 PM

## 2021-05-29 ENCOUNTER — Ambulatory Visit (HOSPITAL_BASED_OUTPATIENT_CLINIC_OR_DEPARTMENT_OTHER): Payer: BC Managed Care – PPO | Admitting: Anesthesiology

## 2021-05-29 ENCOUNTER — Encounter (HOSPITAL_BASED_OUTPATIENT_CLINIC_OR_DEPARTMENT_OTHER): Payer: Self-pay | Admitting: Orthopaedic Surgery

## 2021-05-29 ENCOUNTER — Encounter (HOSPITAL_BASED_OUTPATIENT_CLINIC_OR_DEPARTMENT_OTHER)
Admission: RE | Disposition: A | Discharge status: Home / Self Care | Payer: Self-pay | Source: Home / Self Care | Attending: Orthopaedic Surgery

## 2021-05-29 ENCOUNTER — Other Ambulatory Visit: Payer: Self-pay

## 2021-05-29 ENCOUNTER — Ambulatory Visit (HOSPITAL_BASED_OUTPATIENT_CLINIC_OR_DEPARTMENT_OTHER)
Admission: RE | Admit: 2021-05-29 | Discharge: 2021-05-29 | Disposition: A | Discharge status: Home / Self Care | Payer: BC Managed Care – PPO | Attending: Orthopaedic Surgery | Admitting: Orthopaedic Surgery

## 2021-05-29 DIAGNOSIS — S83282A Other tear of lateral meniscus, current injury, left knee, initial encounter: Secondary | ICD-10-CM

## 2021-05-29 DIAGNOSIS — M7631 Iliotibial band syndrome, right leg: Secondary | ICD-10-CM | POA: Insufficient documentation

## 2021-05-29 DIAGNOSIS — M6751 Plica syndrome, right knee: Secondary | ICD-10-CM | POA: Insufficient documentation

## 2021-05-29 HISTORY — PX: FASCIOTOMY: SHX132

## 2021-05-29 HISTORY — PX: KNEE ARTHROSCOPY WITH EXCISION PLICA: SHX5647

## 2021-05-29 LAB — POCT PREGNANCY, URINE: Preg Test, Ur: NEGATIVE

## 2021-05-29 SURGERY — ARTHROSCOPY, KNEE, WITH PLICA EXCISION
Anesthesia: General | Site: Knee | Laterality: Right

## 2021-05-29 MED ORDER — LACTATED RINGERS IV SOLN
INTRAVENOUS | Status: DC
Start: 2021-05-29 — End: 2021-05-29

## 2021-05-29 MED ORDER — FENTANYL CITRATE (PF) 100 MCG/2ML IJ SOLN
INTRAMUSCULAR | Status: AC
  Filled 2021-05-29: qty 2

## 2021-05-29 MED ORDER — VANCOMYCIN HCL 1000 MG IV SOLR
INTRAVENOUS | Status: DC | PRN
  Administered 2021-05-29: 1000 mg via TOPICAL

## 2021-05-29 MED ORDER — TRAMADOL HCL 50 MG PO TABS: 50.0000 mg | ORAL_TABLET | Freq: Four times a day (QID) | ORAL | 0 refills | Status: AC | PRN

## 2021-05-29 MED ORDER — ACETAMINOPHEN 500 MG PO TABS
ORAL_TABLET | ORAL | Status: AC
  Filled 2021-05-29: qty 2

## 2021-05-29 MED ORDER — MELOXICAM 15 MG PO TABS: 15.0000 mg | ORAL_TABLET | Freq: Every day | ORAL | 0 refills | Status: AC

## 2021-05-29 MED ORDER — SCOPOLAMINE 1 MG/3DAYS TD PT72
MEDICATED_PATCH | TRANSDERMAL | Status: AC
  Filled 2021-05-29: qty 1

## 2021-05-29 MED ORDER — SCOPOLAMINE 1 MG/3DAYS TD PT72
1.0000 | MEDICATED_PATCH | TRANSDERMAL | Status: DC
  Administered 2021-05-29: 1.5 mg via TRANSDERMAL

## 2021-05-29 MED ORDER — MIDAZOLAM HCL 2 MG/2ML IJ SOLN
INTRAMUSCULAR | Status: AC
  Filled 2021-05-29: qty 2

## 2021-05-29 MED ORDER — FENTANYL CITRATE (PF) 100 MCG/2ML IJ SOLN
INTRAMUSCULAR | Status: DC | PRN
Start: 2021-05-29 — End: 2021-05-29
  Administered 2021-05-29 (×2): 50 ug via INTRAVENOUS

## 2021-05-29 MED ORDER — ONDANSETRON HCL 4 MG/2ML IJ SOLN
INTRAMUSCULAR | Status: DC | PRN
  Administered 2021-05-29: 4 mg via INTRAVENOUS

## 2021-05-29 MED ORDER — ONDANSETRON HCL 4 MG PO TABS: 4.0000 mg | ORAL_TABLET | Freq: Three times a day (TID) | ORAL | 0 refills | Status: AC | PRN

## 2021-05-29 MED ORDER — ACETAMINOPHEN 500 MG PO TABS: 1000.0000 mg | ORAL_TABLET | Freq: Three times a day (TID) | ORAL | 0 refills | Status: AC

## 2021-05-29 MED ORDER — PROPOFOL 500 MG/50ML IV EMUL
INTRAVENOUS | Status: DC | PRN
  Administered 2021-05-29: 200 ug/kg/min via INTRAVENOUS

## 2021-05-29 MED ORDER — CEFAZOLIN SODIUM-DEXTROSE 2-4 GM/100ML-% IV SOLN
2.0000 g | INTRAVENOUS | Status: AC
  Administered 2021-05-29: 2 g via INTRAVENOUS

## 2021-05-29 MED ORDER — MIDAZOLAM HCL 5 MG/5ML IJ SOLN
INTRAMUSCULAR | Status: DC | PRN
  Administered 2021-05-29: 2 mg via INTRAVENOUS

## 2021-05-29 MED ORDER — DEXAMETHASONE SODIUM PHOSPHATE 10 MG/ML IJ SOLN
INTRAMUSCULAR | Status: AC
  Filled 2021-05-29: qty 1

## 2021-05-29 MED ORDER — ACETAMINOPHEN 500 MG PO TABS
1000.0000 mg | ORAL_TABLET | Freq: Once | ORAL | Status: AC
  Administered 2021-05-29: 1000 mg via ORAL

## 2021-05-29 MED ORDER — CEFAZOLIN SODIUM-DEXTROSE 2-4 GM/100ML-% IV SOLN
INTRAVENOUS | Status: AC
  Filled 2021-05-29: qty 100

## 2021-05-29 MED ORDER — ONDANSETRON HCL 4 MG/2ML IJ SOLN: 4.0000 mg | Freq: Once | INTRAMUSCULAR | Status: DC | PRN

## 2021-05-29 MED ORDER — AMISULPRIDE (ANTIEMETIC) 5 MG/2ML IV SOLN: 10.0000 mg | Freq: Once | INTRAVENOUS | Status: DC | PRN

## 2021-05-29 MED ORDER — PROPOFOL 10 MG/ML IV BOLUS
INTRAVENOUS | Status: DC | PRN
  Administered 2021-05-29: 160 mg via INTRAVENOUS

## 2021-05-29 MED ORDER — SODIUM CHLORIDE 0.9 % IR SOLN
Status: DC | PRN
  Administered 2021-05-29: 1000 mL

## 2021-05-29 MED ORDER — KETOROLAC TROMETHAMINE 30 MG/ML IJ SOLN
30.0000 mg | Freq: Once | INTRAMUSCULAR | Status: AC | PRN
  Administered 2021-05-29: 30 mg via INTRAVENOUS

## 2021-05-29 MED ORDER — HYDROMORPHONE HCL 1 MG/ML IJ SOLN: 0.2500 mg | INTRAMUSCULAR | Status: DC | PRN

## 2021-05-29 MED ORDER — LIDOCAINE HCL (CARDIAC) PF 100 MG/5ML IV SOSY
PREFILLED_SYRINGE | INTRAVENOUS | Status: DC | PRN
  Administered 2021-05-29: 60 mg via INTRAVENOUS

## 2021-05-29 MED ORDER — ONDANSETRON HCL 4 MG/2ML IJ SOLN
INTRAMUSCULAR | Status: AC
  Filled 2021-05-29: qty 2

## 2021-05-29 MED ORDER — BUPIVACAINE HCL (PF) 0.25 % IJ SOLN
INTRAMUSCULAR | Status: DC | PRN
Start: 2021-05-29 — End: 2021-05-29
  Administered 2021-05-29: 30 mL

## 2021-05-29 MED ORDER — OXYCODONE HCL 5 MG PO TABS: 5.0000 mg | ORAL_TABLET | Freq: Once | ORAL | Status: DC | PRN

## 2021-05-29 MED ORDER — OXYCODONE HCL 5 MG/5ML PO SOLN: 5.0000 mg | Freq: Once | ORAL | Status: DC | PRN

## 2021-05-29 MED ORDER — DEXAMETHASONE SODIUM PHOSPHATE 10 MG/ML IJ SOLN
INTRAMUSCULAR | Status: DC | PRN
  Administered 2021-05-29: 10 mg via INTRAVENOUS

## 2021-05-29 MED ORDER — PROPOFOL 10 MG/ML IV BOLUS
INTRAVENOUS | Status: AC
  Filled 2021-05-29: qty 20

## 2021-05-29 MED ORDER — MEPERIDINE HCL 25 MG/ML IJ SOLN: 6.2500 mg | INTRAMUSCULAR | Status: DC | PRN

## 2021-05-29 MED ORDER — ASPIRIN 81 MG PO CHEW: 81.0000 mg | CHEWABLE_TABLET | Freq: Two times a day (BID) | ORAL | 0 refills | Status: AC

## 2021-05-29 SURGICAL SUPPLY — 71 items
APL PRP STRL LF DISP 70% ISPRP (MISCELLANEOUS) ×1
APL SKNCLS STERI-STRIP NONHPOA (GAUZE/BANDAGES/DRESSINGS)
BANDAGE ESMARK 6X9 LF (GAUZE/BANDAGES/DRESSINGS) IMPLANT
BENZOIN TINCTURE PRP APPL 2/3 (GAUZE/BANDAGES/DRESSINGS) IMPLANT
BLADE HEX COATED 2.75 (ELECTRODE) IMPLANT
BLADE SURG 10 STRL SS (BLADE) ×2 IMPLANT
BLADE SURG 15 STRL LF DISP TIS (BLADE) ×2 IMPLANT
BLADE SURG 15 STRL SS (BLADE) ×4
BNDG CMPR 9X6 STRL LF SNTH (GAUZE/BANDAGES/DRESSINGS)
BNDG ELASTIC 4X5.8 VLCR STR LF (GAUZE/BANDAGES/DRESSINGS) IMPLANT
BNDG ELASTIC 6X5.8 VLCR STR LF (GAUZE/BANDAGES/DRESSINGS) ×2 IMPLANT
BNDG ESMARK 6X9 LF (GAUZE/BANDAGES/DRESSINGS)
BNDG GAUZE ELAST 4 BULKY (GAUZE/BANDAGES/DRESSINGS) IMPLANT
CANISTER SUCT 1200ML W/VALVE (MISCELLANEOUS) IMPLANT
CHLORAPREP W/TINT 26 (MISCELLANEOUS) ×2 IMPLANT
CUFF TOURN SGL QUICK 34 (TOURNIQUET CUFF) ×2
CUFF TRNQT CYL 34X4.125X (TOURNIQUET CUFF) ×1 IMPLANT
DISSECTOR 4.0MMX13CM CVD (MISCELLANEOUS) ×2 IMPLANT
DRAPE ARTHROSCOPY W/POUCH 90 (DRAPES) ×2 IMPLANT
DRAPE EXTREMITY T 121X128X90 (DISPOSABLE) ×2 IMPLANT
DRAPE IMP U-DRAPE 54X76 (DRAPES) ×2 IMPLANT
DRAPE INCISE IOBAN 66X45 STRL (DRAPES) IMPLANT
DRAPE U-SHAPE 47X51 STRL (DRAPES) ×2 IMPLANT
DRSG MEPILEX BORDER 4X8 (GAUZE/BANDAGES/DRESSINGS) ×2 IMPLANT
DRSG PAD ABDOMINAL 8X10 ST (GAUZE/BANDAGES/DRESSINGS) IMPLANT
ELECT REM PT RETURN 9FT ADLT (ELECTROSURGICAL) ×2
ELECTRODE REM PT RTRN 9FT ADLT (ELECTROSURGICAL) ×1 IMPLANT
FIBER TAPE 2MM (SUTURE) IMPLANT
GAUZE SPONGE 4X4 12PLY STRL (GAUZE/BANDAGES/DRESSINGS) ×2 IMPLANT
GAUZE XEROFORM 1X8 LF (GAUZE/BANDAGES/DRESSINGS) IMPLANT
GLOVE SRG 8 PF TXTR STRL LF DI (GLOVE) ×1 IMPLANT
GLOVE SURG ENC MOIS LTX SZ6.5 (GLOVE) ×3 IMPLANT
GLOVE SURG LTX SZ8 (GLOVE) ×4 IMPLANT
GLOVE SURG UNDER POLY LF SZ6.5 (GLOVE) ×4 IMPLANT
GLOVE SURG UNDER POLY LF SZ8 (GLOVE) ×2
GOWN STRL REUS W/ TWL LRG LVL3 (GOWN DISPOSABLE) ×1 IMPLANT
GOWN STRL REUS W/TWL LRG LVL3 (GOWN DISPOSABLE) ×4
GOWN STRL REUS W/TWL XL LVL3 (GOWN DISPOSABLE) ×2 IMPLANT
IMMOBILIZER KNEE 22 UNIV (SOFTGOODS) IMPLANT
KIT TURNOVER KIT B (KITS) ×2 IMPLANT
MANIFOLD NEPTUNE II (INSTRUMENTS) IMPLANT
NDL SUT 6 .5 CRC .975X.05 MAYO (NEEDLE) IMPLANT
NEEDLE MAYO TAPER (NEEDLE)
NS IRRIG 1000ML POUR BTL (IV SOLUTION) ×2 IMPLANT
PACK ARTHROSCOPY DSU (CUSTOM PROCEDURE TRAY) ×2 IMPLANT
PACK BASIN DAY SURGERY FS (CUSTOM PROCEDURE TRAY) ×2 IMPLANT
PAD CAST 4YDX4 CTTN HI CHSV (CAST SUPPLIES) IMPLANT
PADDING CAST COTTON 4X4 STRL (CAST SUPPLIES)
PADDING CAST COTTON 6X4 STRL (CAST SUPPLIES) IMPLANT
PENCIL SMOKE EVACUATOR (MISCELLANEOUS) ×2 IMPLANT
PORT APPOLLO RF 90DEGREE MULTI (SURGICAL WAND) IMPLANT
SLEEVE SCD COMPRESS KNEE MED (STOCKING) ×2 IMPLANT
SPIKE FLUID TRANSFER (MISCELLANEOUS) IMPLANT
SPONGE T-LAP 18X18 ~~LOC~~+RFID (SPONGE) ×2 IMPLANT
STRIP CLOSURE SKIN 1/2X4 (GAUZE/BANDAGES/DRESSINGS) ×2 IMPLANT
SUCTION FRAZIER HANDLE 10FR (MISCELLANEOUS) ×1
SUCTION TUBE FRAZIER 10FR DISP (MISCELLANEOUS) ×1 IMPLANT
SUT MNCRL AB 3-0 PS2 18 (SUTURE) IMPLANT
SUT MNCRL AB 4-0 PS2 18 (SUTURE) ×2 IMPLANT
SUT VIC AB 0 CT1 18XCR BRD 8 (SUTURE) IMPLANT
SUT VIC AB 0 CT1 8-18 (SUTURE)
SUT VIC AB 1 CT1 27 (SUTURE)
SUT VIC AB 1 CT1 27XBRD ANBCTR (SUTURE) IMPLANT
SUT VIC AB 3-0 SH 27 (SUTURE) ×2
SUT VIC AB 3-0 SH 27X BRD (SUTURE) ×1 IMPLANT
SYR BULB EAR ULCER 3OZ GRN STR (SYRINGE) IMPLANT
TOWEL GREEN STERILE FF (TOWEL DISPOSABLE) ×4 IMPLANT
TUBE CONNECTING 20X1/4 (TUBING) ×2 IMPLANT
TUBING ARTHROSCOPY IRRIG 16FT (MISCELLANEOUS) ×2 IMPLANT
WATER STERILE IRR 1000ML POUR (IV SOLUTION) ×2 IMPLANT
YANKAUER SUCT BULB TIP NO VENT (SUCTIONS) IMPLANT

## 2021-05-29 NOTE — Transfer of Care (Signed)
Immediate Anesthesia Transfer of Care Note ? ?Patient: Heather Peters ? ?Procedure(s) Performed: KNEE ARTHROSCOPY WITH EXCISION PLICA (Right: Knee) ?FASCIOTOMY, IT BAND (Right: Knee) ? ?Patient Location: PACU ? ?Anesthesia Type:General ? ?Level of Consciousness: sedated ? ?Airway & Oxygen Therapy: Patient Spontanous Breathing and Patient connected to face mask oxygen ? ?Post-op Assessment: Report given to RN and Post -op Vital signs reviewed and stable ? ?Post vital signs: Reviewed and stable ? ?Last Vitals:  ?Vitals Value Taken Time  ?BP 93/54 05/29/21 1345  ?Temp    ?Pulse 48 05/29/21 1348  ?Resp 12 05/29/21 1348  ?SpO2 99 % 05/29/21 1348  ?Vitals shown include unvalidated device data. ? ?Last Pain:  ?Vitals:  ? 05/29/21 1139  ?TempSrc: Oral  ?PainSc: 3   ?   ? ?  ? ?Complications: No notable events documented. ?

## 2021-05-29 NOTE — Discharge Instructions (Addendum)
Ophelia Charter MD, MPH ?Noemi Chapel, PA-C ?[facility] ?[address], [address] ?[phone] (tel)   [phone] (fax) ? ? ?POST-OPERATIVE INSTRUCTIONS - Knee Arthroscopy ? ?WOUND CARE ?- You may remove the Operative Dressing on Post-Op Day #3 (72hrs after surgery).   ?-  Alternatively if you would like you can leave dressing on until follow-up if within 7-8 days but keep it dry. ?- Leave steri-strips in place until they fall off on their own, usually 2 weeks postop. ?- An ACE wrap may be used to control swelling, do not wrap this too tight.  If the initial ACE wrap feels too tight you may loosen it. ?- There may be a small amount of fluid/bleeding leaking at the surgical site.  ?- This is normal; the knee is filled with fluid during the procedure and can leak for 24-48hrs after surgery. You may change/reinforce the bandage as needed.  ?- Use the Cryocuff or Ice as often as possible for the first 7 days, then as needed for pain relief. Always keep a towel, ACE wrap or other barrier between the cooling unit and your skin.  ?- You may shower on Post-Op Day #3. Gently pat the area dry.  ?- Do not soak the knee in water or submerge it.  ?- Do not go swimming in the pool or ocean until 4 weeks after surgery or when otherwise instructed.  Keep incisions as dry as possible. ? ? ?BRACE/AMBULATION ?-  You will not need a brace after this procedure.   ?-  You can put full weight on your operative leg as you feel comfortable ?- You may use crutches or a walker initially to help you ambulate, but this is not required ? ?REGIONAL ANESTHESIA (NERVE BLOCKS) ?- The anesthesia team may have performed a nerve block for you if safe in the setting of your care.  This is a great tool used to minimize pain.  Typically the block may start wearing off overnight.  This can be a challenging period but please utilize your as needed pain medications to try and manage this period and know it will be a brief transition  as the nerve block wears completely  ? ?POST-OP MEDICATIONS - Multimodal approach to pain control ?- In general your pain will be controlled with a combination of substances.  Prescriptions unless otherwise discussed are electronically sent to your pharmacy.  This is a carefully made plan we use to minimize narcotic use.    ? ?- Meloxicam - Anti-inflammatory medication taken on a scheduled basis ?- Acetaminophen - Non-narcotic pain medicine taken on a scheduled basis. NEXT DOSE AFTER 7:40PM AS NEEDED. ?- Tramadol  - This is a strong narcotic, to be used only on an "as needed" basis for SEVERE pain. ?- Aspirin 81mg  - This medicine is used to minimize the risk of blood clots after surgery. ?-  Zofran - take as needed for nausea ? ? ?FOLLOW-UP  ?? Please call the office to schedule a follow-up appointment for your incision check, 7-10 days post-operatively. ? ?? IF YOU HAVE ANY QUESTIONS, PLEASE FEEL FREE TO CALL OUR OFFICE. ? ? ?HELPFUL INFORMATION ? ?- If you had a block, it will wear off between 8-24 hrs postop typically.  This is period when your pain may go from nearly zero to the pain you would have had post-op without the block.  This is an abrupt transition but nothing dangerous is happening.  You may take an extra dose of narcotic  when this happens. ? ?? Keep your leg elevated to decrease swelling, which will then in turn decrease your pain. I would elevate the foot of your bed by putting a couple of couch pillows between your mattress and box spring. I would not keep pillow directly under your ankle. ? ?- Do not sleep with a pillow behind your knee even if it is more comfortable as this may make it harder to get your knee fully straight long term. ? ?? There will be MORE swelling on days 1-3 than there is on the day of surgery.  This also is normal. The swelling will decrease with the anti-inflammatory medication, ice and keeping it elevated. The swelling will make it more difficult to bend your knee. As the  swelling goes down your motion will become easier ? ?? You may develop swelling and bruising that extends from your knee down to your calf and perhaps even to your foot over the next week. Do not be alarmed. This too is normal, and it is due to gravity ? ?? There may be some numbness adjacent to the incision site. This may last for 6-12 months or longer in some patients and is expected. ? ?? You may return to sedentary work/school in the next couple of days when you feel up to it. You will need to keep your leg elevated as much as possible  ? ?? You should wean off your narcotic medicines as soon as you are able.  Most patients will be off or using minimal narcotics before their first postop appointment.  ? ?? We suggest you use the pain medication the first night prior to going to bed, in order to ease any pain when the anesthesia wears off. You should avoid taking pain medications on an empty stomach as it will make you nauseous. ? ?? Do not drink alcoholic beverages or take illicit drugs when taking pain medications. ? ?? It is against the law to drive while taking narcotics. You cannot drive if your Right leg is in brace locked in extension. ? ?? Pain medication may make you constipated.  Below are a few solutions to try in this order: ? o Decrease the amount of pain medication if you aren't having pain. ? o Drink lots of decaffeinated fluids. ? o Drink prune juice and/or eat dried prunes ? ? o If the first 3 don't work start with additional solutions ? o Take Colace - an over-the-counter stool softener ? o Take Senokot - an over-the-counter laxative ? o Take Miralax - a stronger over-the-counter laxative ? ? ? ?For more information including helpful videos and documents visit our website:  ? ?https://[email or web address]-information.[email or web address] ? ? ? ?Post Anesthesia Home Care Instructions ? ?Activity: ?Get plenty of rest for the remainder of the day. A responsible individual must stay with you for 24 hours  following the procedure.  ?For the next 24 hours, DO NOT: ?-Drive a car ?-Paediatric nurse ?-Drink alcoholic beverages ?-Take any medication unless instructed by your physician ?-Make any legal decisions or sign important papers. ? ?Meals: ?Start with liquid foods such as gelatin or soup. Progress to regular foods as tolerated. Avoid greasy, spicy, heavy foods. If nausea and/or vomiting occur, drink only clear liquids until the nausea and/or vomiting subsides. Call your physician if vomiting continues. ? ?Special Instructions/Symptoms: ?Your throat may feel dry or sore from the anesthesia or the breathing tube placed in your throat during surgery. If this causes discomfort, gargle with warm salt water. The  discomfort should disappear within 24 hours. ? ?If you had a scopolamine patch placed behind your ear for the management of post- operative nausea and/or vomiting: ? ?1. The medication in the patch is effective for 72 hours, after which it should be removed.  Wrap patch in a tissue and discard in the trash. Wash hands thoroughly with soap and water. ?2. You may remove the patch earlier than 72 hours if you experience unpleasant side effects which may include dry mouth, dizziness or visual disturbances. ?3. Avoid touching the patch. Wash your hands with soap and water after contact with the patch. ?    ?

## 2021-05-29 NOTE — Interval H&P Note (Signed)
All questions answered

## 2021-05-29 NOTE — Anesthesia Preprocedure Evaluation (Addendum)
Anesthesia Evaluation  ?Patient identified by MRN, date of birth, ID band ?Patient awake ? ? ? ?Reviewed: ?Allergy & Precautions, NPO status , Patient's Chart, lab work & pertinent test results ? ?History of Anesthesia Complications ?(+) PONV and history of anesthetic complications ? ?Airway ?Mallampati: II ? ?TM Distance: >3 FB ?Neck ROM: Full ? ? ? Dental ? ?(+) Teeth Intact, Dental Advisory Given ?  ?Pulmonary ?neg pulmonary ROS,  ?  ?breath sounds clear to auscultation ? ? ? ? ? ? Cardiovascular ?negative cardio ROS ? ? ?Rhythm:Regular  ? ?  ?Neuro/Psych ?negative neurological ROS ? negative psych ROS  ? GI/Hepatic ?negative GI ROS, Neg liver ROS,   ?Endo/Other  ?negative endocrine ROS ? Renal/GU ?negative Renal ROS  ? ?  ?Musculoskeletal ?right knee PLICA syndrome, IT band syndrome  ? Abdominal ?  ?Peds ? Hematology ?negative hematology ROS ?(+)   ?Anesthesia Other Findings ? ? Reproductive/Obstetrics ?negative OB ROS ?Lab Results ?     Component                Value               Date                 ?     PREGTESTUR               NEGATIVE            05/29/2021           ? ? ?  ? ? ? ? ? ? ? ? ? ? ? ? ? ?  ?  ? ? ? ? ? ? ? ?Anesthesia Physical ?Anesthesia Plan ? ?ASA: 1 ? ?Anesthesia Plan: General  ? ?Post-op Pain Management: Tylenol PO (pre-op)* and Toradol IV (intra-op)*  ? ?Induction: Intravenous ? ?PONV Risk Score and Plan: 4 or greater and Ondansetron, Dexamethasone, Midazolam, Scopolamine patch - Pre-op, TIVA and Propofol infusion ? ?Airway Management Planned: LMA ? ?Additional Equipment: None ? ?Intra-op Plan:  ? ?Post-operative Plan: Extubation in OR ? ?Informed Consent: I have reviewed the patients History and Physical, chart, labs and discussed the procedure including the risks, benefits and alternatives for the proposed anesthesia with the patient or authorized representative who has indicated his/her understanding and acceptance.  ? ? ? ?Dental advisory  given ? ?Plan Discussed with: CRNA and Anesthesiologist ? ?Anesthesia Plan Comments:   ? ? ? ? ? ?Anesthesia Quick Evaluation ? ?

## 2021-05-29 NOTE — Anesthesia Procedure Notes (Signed)
Procedure Name: LMA Insertion ?Date/Time: 05/29/2021 1:00 PM ?Performed by: Maryella Shivers, CRNA ?Pre-anesthesia Checklist: Patient identified, Emergency Drugs available, Suction available and Patient being monitored ?Patient Re-evaluated:Patient Re-evaluated prior to induction ?Oxygen Delivery Method: Circle system utilized ?Preoxygenation: Pre-oxygenation with 100% oxygen ?Induction Type: IV induction ?Ventilation: Mask ventilation without difficulty ?LMA: LMA inserted ?LMA Size: 4.0 ?Number of attempts: 1 ?Airway Equipment and Method: Bite block ?Placement Confirmation: positive ETCO2 ?Tube secured with: Tape ?Dental Injury: Teeth and Oropharynx as per pre-operative assessment  ? ? ? ? ?

## 2021-05-30 ENCOUNTER — Encounter (HOSPITAL_BASED_OUTPATIENT_CLINIC_OR_DEPARTMENT_OTHER): Payer: Self-pay | Admitting: Orthopaedic Surgery

## 2021-05-30 NOTE — Op Note (Signed)
Orthopaedic Surgery Operative Note (CSN: [id number]) ? ?Heather Peters  1986/03/07 ?Date of Surgery: 05/29/2021 ? ? ?Diagnoses:  ?Right fat pad impingement and IT band impingement and syndrome ? ?Procedure: ?Right debridement of fat pad ?Right open IT band release ?  ?Operative Finding ?Exam under anesthesia: Full motion no limitation no instability ?Suprapatellar pouch: Normal ?Patellofemoral Compartment: Normal ?Medial Compartment: Normal ?Lateral Compartment: Normal ?Intercondylar Notch: Abundant fat pad that was impinging laterally however no other findings. ? ?Successful completion of the planned procedure.  Patient's fat pad was hypertrophic and impinging laterally likely.  Her meniscus had some hypertrophic synovium attached anteriorly however there is no other findings within the meniscus.  IT band release was appropriate. ? ?Post-operative plan: The patient will be weightbearing to tolerance.  The patient will be discharged home.  DVT prophylaxis Aspirin 81 mg twice daily for 6 weeks.  Pain control with PRN pain medication preferring oral medicines.  Follow up plan will be scheduled in approximately 7 days for incision check. ? ?Post-Op Diagnosis: Same ?Surgeons:Primary: Hiram Gash, MD ?Assistants:Caroline McBane PA-C ?Location: [facility] OR ROOM 6 ?Anesthesia: General with local ?Antibiotics: Ancef 2 g with local vancomycin powder 1 g at the surgical site ?Tourniquet time:  ?Total Tourniquet Time Documented: ?Thigh (Right) - 22 minutes ?Total: Thigh (Right) - 22 minutes ? ?Estimated Blood Loss: Minimal ?Complications: None ?Specimens: None ?Implants: ?* No implants in log * ? ?Indications for Surgery:   ?Heather Peters is a 36 y.o. female with lateral knee pain concerning for fat pad impingement as she had had that on the contralateral side and did well with the scope.  Additionally she had IT band syndrome that have been refractory to nonoperative measures.  Benefits and risks of operative and nonoperative management  were discussed prior to surgery with patient/guardian(s) and informed consent form was completed.  Specific risks including infection, need for additional surgery, IT band rupture, stiffness, pain laterally amongst others. ? ? ?Procedure:   ?The patient was identified properly. Informed consent was obtained and the surgical site was marked. The patient was taken up to suite where general anesthesia was induced. The patient was placed in the supine position with a post against the surgical leg and a nonsterile tourniquet applied. The surgical leg was then prepped and draped usual sterile fashion.  A standard surgical timeout was performed.  2 standard anterior portals were made and diagnostic arthroscopy performed. Please note the findings as noted above. ? ?Cleared the joint as above.  We used a shaver to debride back the fat pad.  We debrided the hypertrophic synovium from the lateral meniscus.  Joint was cleared again. ? ?We identified the lateral condyle and made a 3 cm incision over the posterior border the IT band.  We carefully dissected through skin sharply achieving hemostasis as we progressed.  We identified the posterior border the IT band and lifted off the underlying tissue to protect the peroneal nerve.  We then performed a triangular shaped fasciotomy resecting the posterior one quarter of the IT band and ensuring that it did not catch over the lateral epicondyle as the knee went through range of motion.  With an appropriate release.  Hemostasis was checked again.  We irrigated and closed in a multilayer fashion observable suture.  Sterile dressing placed. ? ?Noemi Chapel, PA-C, present and scrubbed throughout the case, critical for completion in a timely fashion, and for retraction, instrumentation, closure. ? ? ?

## 2021-06-01 NOTE — Anesthesia Postprocedure Evaluation (Signed)
Anesthesia Post Note ? ?Patient: Lafonda Patron ? ?Procedure(s) Performed: KNEE ARTHROSCOPY WITH EXCISION PLICA (Right: Knee) ?FASCIOTOMY, IT BAND (Right: Knee) ? ?  ? ?Patient location during evaluation: PACU ?Anesthesia Type: General ?Level of consciousness: awake and alert ?Pain management: pain level controlled ?Vital Signs Assessment: post-procedure vital signs reviewed and stable ?Respiratory status: spontaneous breathing, nonlabored ventilation, respiratory function stable and patient connected to nasal cannula oxygen ?Cardiovascular status: blood pressure returned to baseline and stable ?Postop Assessment: no apparent nausea or vomiting ?Anesthetic complications: no ? ? ?No notable events documented. ? ?Last Vitals:  ?Vitals:  ? 05/29/21 1430 05/29/21 1516  ?BP: 112/82 122/73  ?Pulse: (!) 59 70  ?Resp: (!) 27 20  ?Temp:  36.5 ?C  ?SpO2: 98% 98%  ?  ?Last Pain:  ?Vitals:  ? 05/30/21 0939  ?TempSrc:   ?PainSc: 2   ? ? ?  ?  ?  ?  ?  ?  ? ?Gustin Zobrist ? ? ? ? ?

## 2021-06-03 DIAGNOSIS — M6281 Muscle weakness (generalized): Secondary | ICD-10-CM | POA: Diagnosis not present

## 2021-06-03 DIAGNOSIS — M6751 Plica syndrome, right knee: Secondary | ICD-10-CM | POA: Diagnosis not present

## 2021-06-03 DIAGNOSIS — R269 Unspecified abnormalities of gait and mobility: Secondary | ICD-10-CM | POA: Diagnosis not present

## 2021-06-05 DIAGNOSIS — J3089 Other allergic rhinitis: Secondary | ICD-10-CM | POA: Diagnosis not present

## 2021-06-05 DIAGNOSIS — J301 Allergic rhinitis due to pollen: Secondary | ICD-10-CM | POA: Diagnosis not present

## 2021-06-05 DIAGNOSIS — J3081 Allergic rhinitis due to animal (cat) (dog) hair and dander: Secondary | ICD-10-CM | POA: Diagnosis not present

## 2021-06-06 DIAGNOSIS — M6281 Muscle weakness (generalized): Secondary | ICD-10-CM | POA: Diagnosis not present

## 2021-06-06 DIAGNOSIS — M6751 Plica syndrome, right knee: Secondary | ICD-10-CM | POA: Diagnosis not present

## 2021-06-06 DIAGNOSIS — R269 Unspecified abnormalities of gait and mobility: Secondary | ICD-10-CM | POA: Diagnosis not present

## 2021-06-11 DIAGNOSIS — J3081 Allergic rhinitis due to animal (cat) (dog) hair and dander: Secondary | ICD-10-CM | POA: Diagnosis not present

## 2021-06-11 DIAGNOSIS — J3089 Other allergic rhinitis: Secondary | ICD-10-CM | POA: Diagnosis not present

## 2021-06-11 DIAGNOSIS — J301 Allergic rhinitis due to pollen: Secondary | ICD-10-CM | POA: Diagnosis not present

## 2021-06-12 DIAGNOSIS — M25561 Pain in right knee: Secondary | ICD-10-CM | POA: Diagnosis not present

## 2021-06-12 DIAGNOSIS — R262 Difficulty in walking, not elsewhere classified: Secondary | ICD-10-CM | POA: Diagnosis not present

## 2021-06-12 DIAGNOSIS — Z4789 Encounter for other orthopedic aftercare: Secondary | ICD-10-CM | POA: Diagnosis not present

## 2021-06-18 DIAGNOSIS — J3081 Allergic rhinitis due to animal (cat) (dog) hair and dander: Secondary | ICD-10-CM | POA: Diagnosis not present

## 2021-06-18 DIAGNOSIS — J301 Allergic rhinitis due to pollen: Secondary | ICD-10-CM | POA: Diagnosis not present

## 2021-06-18 DIAGNOSIS — J3089 Other allergic rhinitis: Secondary | ICD-10-CM | POA: Diagnosis not present

## 2021-06-26 DIAGNOSIS — Z4789 Encounter for other orthopedic aftercare: Secondary | ICD-10-CM | POA: Diagnosis not present

## 2021-06-26 DIAGNOSIS — D485 Neoplasm of uncertain behavior of skin: Secondary | ICD-10-CM | POA: Diagnosis not present

## 2021-06-26 DIAGNOSIS — Z872 Personal history of diseases of the skin and subcutaneous tissue: Secondary | ICD-10-CM | POA: Diagnosis not present

## 2021-06-26 DIAGNOSIS — L718 Other rosacea: Secondary | ICD-10-CM | POA: Diagnosis not present

## 2021-06-26 DIAGNOSIS — C44519 Basal cell carcinoma of skin of other part of trunk: Secondary | ICD-10-CM | POA: Diagnosis not present

## 2021-06-26 DIAGNOSIS — R262 Difficulty in walking, not elsewhere classified: Secondary | ICD-10-CM | POA: Diagnosis not present

## 2021-06-26 DIAGNOSIS — M25561 Pain in right knee: Secondary | ICD-10-CM | POA: Diagnosis not present

## 2021-06-26 DIAGNOSIS — D2261 Melanocytic nevi of right upper limb, including shoulder: Secondary | ICD-10-CM | POA: Diagnosis not present

## 2021-06-26 DIAGNOSIS — D225 Melanocytic nevi of trunk: Secondary | ICD-10-CM | POA: Diagnosis not present

## 2021-06-27 DIAGNOSIS — J3089 Other allergic rhinitis: Secondary | ICD-10-CM | POA: Diagnosis not present

## 2021-06-27 DIAGNOSIS — J301 Allergic rhinitis due to pollen: Secondary | ICD-10-CM | POA: Diagnosis not present

## 2021-06-27 DIAGNOSIS — J3081 Allergic rhinitis due to animal (cat) (dog) hair and dander: Secondary | ICD-10-CM | POA: Diagnosis not present

## 2021-07-03 DIAGNOSIS — M25561 Pain in right knee: Secondary | ICD-10-CM | POA: Diagnosis not present

## 2021-07-03 DIAGNOSIS — J3081 Allergic rhinitis due to animal (cat) (dog) hair and dander: Secondary | ICD-10-CM | POA: Diagnosis not present

## 2021-07-03 DIAGNOSIS — R262 Difficulty in walking, not elsewhere classified: Secondary | ICD-10-CM | POA: Diagnosis not present

## 2021-07-03 DIAGNOSIS — Z4789 Encounter for other orthopedic aftercare: Secondary | ICD-10-CM | POA: Diagnosis not present

## 2021-07-03 DIAGNOSIS — J3089 Other allergic rhinitis: Secondary | ICD-10-CM | POA: Diagnosis not present

## 2021-07-03 DIAGNOSIS — J301 Allergic rhinitis due to pollen: Secondary | ICD-10-CM | POA: Diagnosis not present

## 2021-07-09 DIAGNOSIS — M6751 Plica syndrome, right knee: Secondary | ICD-10-CM | POA: Diagnosis not present

## 2021-07-10 DIAGNOSIS — R262 Difficulty in walking, not elsewhere classified: Secondary | ICD-10-CM | POA: Diagnosis not present

## 2021-07-10 DIAGNOSIS — M25561 Pain in right knee: Secondary | ICD-10-CM | POA: Diagnosis not present

## 2021-07-10 DIAGNOSIS — Z4789 Encounter for other orthopedic aftercare: Secondary | ICD-10-CM | POA: Diagnosis not present

## 2021-07-12 DIAGNOSIS — J3081 Allergic rhinitis due to animal (cat) (dog) hair and dander: Secondary | ICD-10-CM | POA: Diagnosis not present

## 2021-07-12 DIAGNOSIS — J3089 Other allergic rhinitis: Secondary | ICD-10-CM | POA: Diagnosis not present

## 2021-07-12 DIAGNOSIS — J301 Allergic rhinitis due to pollen: Secondary | ICD-10-CM | POA: Diagnosis not present

## 2021-07-17 DIAGNOSIS — Z4789 Encounter for other orthopedic aftercare: Secondary | ICD-10-CM | POA: Diagnosis not present

## 2021-07-17 DIAGNOSIS — R262 Difficulty in walking, not elsewhere classified: Secondary | ICD-10-CM | POA: Diagnosis not present

## 2021-07-17 DIAGNOSIS — M25561 Pain in right knee: Secondary | ICD-10-CM | POA: Diagnosis not present

## 2021-07-19 DIAGNOSIS — J3081 Allergic rhinitis due to animal (cat) (dog) hair and dander: Secondary | ICD-10-CM | POA: Diagnosis not present

## 2021-07-19 DIAGNOSIS — J301 Allergic rhinitis due to pollen: Secondary | ICD-10-CM | POA: Diagnosis not present

## 2021-07-19 DIAGNOSIS — J3089 Other allergic rhinitis: Secondary | ICD-10-CM | POA: Diagnosis not present

## 2021-07-24 DIAGNOSIS — R262 Difficulty in walking, not elsewhere classified: Secondary | ICD-10-CM | POA: Diagnosis not present

## 2021-07-24 DIAGNOSIS — M25561 Pain in right knee: Secondary | ICD-10-CM | POA: Diagnosis not present

## 2021-07-24 DIAGNOSIS — Z4789 Encounter for other orthopedic aftercare: Secondary | ICD-10-CM | POA: Diagnosis not present

## 2021-07-26 DIAGNOSIS — J3089 Other allergic rhinitis: Secondary | ICD-10-CM | POA: Diagnosis not present

## 2021-07-26 DIAGNOSIS — L905 Scar conditions and fibrosis of skin: Secondary | ICD-10-CM | POA: Diagnosis not present

## 2021-07-26 DIAGNOSIS — C44519 Basal cell carcinoma of skin of other part of trunk: Secondary | ICD-10-CM | POA: Diagnosis not present

## 2021-07-26 DIAGNOSIS — J3081 Allergic rhinitis due to animal (cat) (dog) hair and dander: Secondary | ICD-10-CM | POA: Diagnosis not present

## 2021-07-26 DIAGNOSIS — J301 Allergic rhinitis due to pollen: Secondary | ICD-10-CM | POA: Diagnosis not present

## 2021-07-31 DIAGNOSIS — J301 Allergic rhinitis due to pollen: Secondary | ICD-10-CM | POA: Diagnosis not present

## 2021-07-31 DIAGNOSIS — J3089 Other allergic rhinitis: Secondary | ICD-10-CM | POA: Diagnosis not present

## 2021-07-31 DIAGNOSIS — J3081 Allergic rhinitis due to animal (cat) (dog) hair and dander: Secondary | ICD-10-CM | POA: Diagnosis not present

## 2021-08-02 DIAGNOSIS — K1120 Sialoadenitis, unspecified: Secondary | ICD-10-CM | POA: Diagnosis not present

## 2021-08-02 DIAGNOSIS — J029 Acute pharyngitis, unspecified: Secondary | ICD-10-CM | POA: Diagnosis not present

## 2021-08-07 DIAGNOSIS — J3089 Other allergic rhinitis: Secondary | ICD-10-CM | POA: Diagnosis not present

## 2021-08-07 DIAGNOSIS — J301 Allergic rhinitis due to pollen: Secondary | ICD-10-CM | POA: Diagnosis not present

## 2021-08-07 DIAGNOSIS — J3081 Allergic rhinitis due to animal (cat) (dog) hair and dander: Secondary | ICD-10-CM | POA: Diagnosis not present

## 2021-08-09 DIAGNOSIS — K1120 Sialoadenitis, unspecified: Secondary | ICD-10-CM | POA: Diagnosis not present

## 2021-10-04 DIAGNOSIS — M6751 Plica syndrome, right knee: Secondary | ICD-10-CM | POA: Diagnosis not present

## 2021-10-10 ENCOUNTER — Emergency Department (HOSPITAL_BASED_OUTPATIENT_CLINIC_OR_DEPARTMENT_OTHER)
Admission: EM | Admit: 2021-10-10 | Discharge: 2021-10-10 | Disposition: A | Discharge status: Home / Self Care | Payer: BC Managed Care – PPO | Attending: Emergency Medicine | Admitting: Emergency Medicine

## 2021-10-10 ENCOUNTER — Emergency Department (HOSPITAL_BASED_OUTPATIENT_CLINIC_OR_DEPARTMENT_OTHER): Payer: BC Managed Care – PPO

## 2021-10-10 ENCOUNTER — Other Ambulatory Visit: Payer: Self-pay

## 2021-10-10 ENCOUNTER — Encounter (HOSPITAL_BASED_OUTPATIENT_CLINIC_OR_DEPARTMENT_OTHER): Payer: Self-pay | Admitting: Emergency Medicine

## 2021-10-10 DIAGNOSIS — R112 Nausea with vomiting, unspecified: Secondary | ICD-10-CM | POA: Insufficient documentation

## 2021-10-10 DIAGNOSIS — R519 Headache, unspecified: Secondary | ICD-10-CM | POA: Insufficient documentation

## 2021-10-10 HISTORY — DX: Basal cell carcinoma of skin, unspecified: C44.91

## 2021-10-10 LAB — BASIC METABOLIC PANEL
Anion gap: 10 (ref 5–15)
BUN: 10 mg/dL (ref 6–20)
CO2: 27 mmol/L (ref 22–32)
Calcium: 9.9 mg/dL (ref 8.9–10.3)
Chloride: 100 mmol/L (ref 98–111)
Creatinine, Ser: 1.04 mg/dL — ABNORMAL HIGH (ref 0.44–1.00)
GFR, Estimated: >60 mL/min (ref >60)
Glucose, Bld: 93 mg/dL (ref 70–99)
Potassium: 3.9 mmol/L (ref 3.5–5.1)
Sodium: 137 mmol/L (ref 135–145)

## 2021-10-10 LAB — CBC
HCT: 40.8 % (ref 36.0–46.0)
Hemoglobin: 14 g/dL (ref 12.0–15.0)
MCH: 32.1 pg (ref 26.0–34.0)
MCHC: 34.3 g/dL (ref 30.0–36.0)
MCV: 93.6 fL (ref 80.0–100.0)
Platelets: 143 10*3/uL — ABNORMAL LOW (ref 150–400)
RBC: 4.36 MIL/uL (ref 3.87–5.11)
RDW: 12.6 % (ref 11.5–15.5)
WBC: 6.3 10*3/uL (ref 4.0–10.5)
nRBC: 0 % (ref 0.0–0.2)

## 2021-10-10 LAB — PREGNANCY, URINE: Preg Test, Ur: NEGATIVE

## 2021-10-10 MED ORDER — DIPHENHYDRAMINE HCL 50 MG/ML IJ SOLN
25.0000 mg | Freq: Once | INTRAMUSCULAR | Status: AC
  Administered 2021-10-10: 25 mg via INTRAVENOUS
  Filled 2021-10-10: qty 1

## 2021-10-10 MED ORDER — SODIUM CHLORIDE 0.9 % IV BOLUS
1000.0000 mL | Freq: Once | INTRAVENOUS | Status: AC
  Administered 2021-10-10: 1000 mL via INTRAVENOUS

## 2021-10-10 MED ORDER — METOCLOPRAMIDE HCL 5 MG/ML IJ SOLN
10.0000 mg | Freq: Once | INTRAMUSCULAR | Status: AC
  Administered 2021-10-10: 10 mg via INTRAVENOUS
  Filled 2021-10-10: qty 2

## 2021-10-10 MED ORDER — LORAZEPAM 2 MG/ML IJ SOLN
0.5000 mg | Freq: Once | INTRAMUSCULAR | Status: AC
  Administered 2021-10-10: 0.5 mg via INTRAVENOUS
  Filled 2021-10-10: qty 1

## 2021-10-10 NOTE — ED Provider Notes (Signed)
[facility] Provider Note   CSN: [id number] Arrival date & time: 10/10/21  1753     History  Chief Complaint  Patient presents with   Headache    Leniyah Martell is a 36 y.o. female.  Patient c/o headache since last evening. States in past few weeks has been getting more frequent headaches, several times. Indicates since last night a particular bothersome left frontal headache. Was relaxing at time, no specific exacerbating factor or activity. Moderate/dull at onset, slowly worse, and has become constant, non radiating. Light and noise make worse. No eye pain or change in vision. No sinus pain or drainage. No ear pain. No uri symptoms. No neck pain or stiffness. Today has noted episodes of nausea/vomiting, not bloody or bilious. No abd pain. Pcp recommended pt come to ED for ct imaging. No recent trauma/fall. No syncope. No change in vision or speech. No numbness/weakness. No loss of normal functional ability. No problems w balance/coordination.   The history is provided by the patient, medical records and the spouse.  Headache Associated symptoms: nausea and vomiting   Associated symptoms: no abdominal pain, no back pain, no cough, no ear pain, no eye pain, no fever, no neck pain, no numbness, no sore throat and no weakness        Home Medications Prior to Admission medications   Medication Sig Start Date End Date Taking? Authorizing Provider  cetirizine (ZYRTEC) 10 MG tablet Take 10 mg by mouth daily.    [provider]  Cholecalciferol (D3-1000 PO) Take by mouth.    [provider]  Cyanocobalamin (VITAMIN B-12) 500 MCG SUBL Place 1 tablet (500 mcg total) under the tongue daily. 02/03/20   Brunetta Jeans, PA-C  meloxicam (MOBIC) 15 MG tablet Take 1 tablet (15 mg total) by mouth daily. For 2 weeks for pain and inflammation. Then take as needed 05/29/21   McBane, Maylene Roes, PA-C  UNABLE TO FIND Med Name: Allergy Shots once a week  injections    [provider]      Allergies    Patient has no known allergies.    Review of Systems   Review of Systems  Constitutional:  Negative for chills and fever.  HENT:  Negative for ear pain, sinus pain and sore throat.   Eyes:  Negative for pain, redness and visual disturbance.  Respiratory:  Negative for cough and shortness of breath.   Cardiovascular:  Negative for chest pain.  Gastrointestinal:  Positive for nausea and vomiting. Negative for abdominal pain.  Genitourinary:  Negative for dysuria and flank pain.  Musculoskeletal:  Negative for back pain and neck pain.  Skin:  Negative for rash.  Neurological:  Positive for headaches. Negative for syncope, speech difficulty, weakness and numbness.  Hematological:  Does not bruise/bleed easily.  Psychiatric/Behavioral:  Negative for confusion.     Physical Exam Updated Vital Signs BP (!) 143/84   Pulse (!) 55   Temp 98.3 F (36.8 C) (Oral)   Resp 16   Ht 1.702 m ('5\' 7"'$ )   Wt 64 kg   LMP 09/16/2021   SpO2 98%   BMI 22.10 kg/m  Physical Exam Vitals and nursing note reviewed.  Constitutional:      Appearance: Normal appearance. She is well-developed.  HENT:     Head: Atraumatic.     Comments: No sinus or temporal tenderness.     Nose: Nose normal.     Mouth/Throat:     Mouth: Mucous membranes are  moist.  Eyes:     General: No scleral icterus.    Extraocular Movements: Extraocular movements intact.     Conjunctiva/sclera: Conjunctivae normal.     Pupils: Pupils are equal, round, and reactive to light.  Neck:     Trachea: No tracheal deviation.     Comments: No neck stiffness or rigidity.  Cardiovascular:     Rate and Rhythm: Normal rate and regular rhythm.     Pulses: Normal pulses.     Heart sounds: Normal heart sounds. No murmur heard.    No friction rub. No gallop.  Pulmonary:     Effort: Pulmonary effort is normal. No respiratory distress.     Breath sounds: Normal breath sounds.   Abdominal:     General: Bowel sounds are normal. There is no distension.     Palpations: Abdomen is soft. There is no mass.     Tenderness: There is no abdominal tenderness.  Genitourinary:    Comments: No cva tenderness.  Musculoskeletal:        General: No swelling.     Cervical back: Normal range of motion and neck supple. No rigidity. No muscular tenderness.     Right lower leg: No edema.     Left lower leg: No edema.  Skin:    General: Skin is warm and dry.     Findings: No rash.  Neurological:     General: No focal deficit present.     Mental Status: She is alert and oriented to person, place, and time.     Comments: Alert, speech normal. No dysarthria or aphasia. Motor/sens grossly intact bil. Steady gait.   Psychiatric:        Mood and Affect: Mood normal.     ED Results / Procedures / Treatments   Labs (all labs ordered are listed, but only abnormal results are displayed) Results for orders placed or performed during the hospital encounter of 10/10/21  Pregnancy, urine  Result Value Ref Range   Preg Test, Ur NEGATIVE NEGATIVE  Basic metabolic panel  Result Value Ref Range   Sodium 137 135 - 145 mmol/L   Potassium 3.9 3.5 - 5.1 mmol/L   Chloride 100 98 - 111 mmol/L   CO2 27 22 - 32 mmol/L   Glucose, Bld 93 70 - 99 mg/dL   BUN 10 6 - 20 mg/dL   Creatinine, Ser 1.04 (H) 0.44 - 1.00 mg/dL   Calcium 9.9 8.9 - 10.3 mg/dL   GFR, Estimated >60 >60 mL/min   Anion gap 10 5 - 15  CBC  Result Value Ref Range   WBC 6.3 4.0 - 10.5 K/uL   RBC 4.36 3.87 - 5.11 MIL/uL   Hemoglobin 14.0 12.0 - 15.0 g/dL   HCT 40.8 36.0 - 46.0 %   MCV 93.6 80.0 - 100.0 fL   MCH 32.1 26.0 - 34.0 pg   MCHC 34.3 30.0 - 36.0 g/dL   RDW 12.6 11.5 - 15.5 %   Platelets 143 (L) 150 - 400 K/uL   nRBC 0.0 0.0 - 0.2 %      EKG None  Radiology CT Head Wo Contrast  Result Date: 10/10/2021 CLINICAL DATA:  Headache, new or worsening, neuro deficit (Age 10-49y) EXAM: CT HEAD WITHOUT CONTRAST  TECHNIQUE: Contiguous axial images were obtained from the base of the skull through the vertex without intravenous contrast. RADIATION DOSE REDUCTION: This exam was performed according to the departmental dose-optimization program which includes automated exposure control, adjustment of the mA  and/or kV according to patient size and/or use of iterative reconstruction technique. COMPARISON:  None Available. FINDINGS: Brain: No acute intracranial abnormality. Specifically, no hemorrhage, hydrocephalus, mass lesion, acute infarction, or significant intracranial injury. Vascular: No hyperdense vessel or unexpected calcification. Skull: No acute calvarial abnormality. Sinuses/Orbits: No acute findings Other: None IMPRESSION: Normal study Electronically Signed   By: Rolm Baptise M.D.   On: 10/10/2021 21:05    Procedures Procedures    Medications Ordered in ED Medications  sodium chloride 0.9 % bolus 1,000 mL (has no administration in time range)  metoCLOPramide (REGLAN) injection 10 mg (has no administration in time range)    ED Course/ Medical Decision Making/ A&P                           Medical Decision Making Problems Addressed: Frontal headache: acute illness or injury with systemic symptoms  Amount and/or Complexity of Data Reviewed Independent Historian: spouse Labs: ordered. Decision-making details documented in ED Course. Radiology: ordered and independent interpretation performed. Decision-making details documented in ED Course.  Risk Prescription drug management.   Iv ns. Continuous pulse ox and cardiac monitoring. Labs ordered/sent. Imaging ordered.   Reviewed nursing notes and prior charts for additional history. External reports reviewed. Additional history from: spouse.   Cardiac monitor: sinus rhythm, rate 60.   Labs reviewed/interpreted by me - chem normal.   CT reviewed/interpreted by me - no hem.  Iv ns bolus. Reglan iv.   Po fluids.   Pt w resolution of  headache w reglan, however felt shaky/restless/anxious ?dystonic rxn.  Iv ns bolus. Benadryl iv. Ativan Iv.   Symptoms completed resolved. Pt indicates feels fine, ready for d/c. No headache.   Pt currently appears stable for d/c. Return precautions provided.            Final Clinical Impression(s) / ED Diagnoses Final diagnoses:  None    Rx / DC Orders ED Discharge Orders     None         Lajean Saver, MD 10/10/21 2232

## 2021-10-10 NOTE — ED Triage Notes (Signed)
Pt endorses headache since last night. Since today, light sensitivity and unable to eat or drink. Took zofran at 2. Denies abd pain. Sent from PCP for head CT

## 2021-10-10 NOTE — ED Notes (Signed)
Patient transported to CT 

## 2021-10-10 NOTE — Discharge Instructions (Addendum)
It was our pleasure to provide your ER care today - we hope that you feel better.  Drink plenty of fluids/stay well hydrated.  Take acetaminophen, ibuprofen, or excedrin as need.   Follow up closely with primary care doctor in the next 1-2 weeks.  Return to ER if worse, new symptoms, worsening or severe headache, persistent vomiting, or other concern.  You were given sedating meds in the ER - no driving for the next 6 hours.

## 2021-10-14 DIAGNOSIS — N939 Abnormal uterine and vaginal bleeding, unspecified: Secondary | ICD-10-CM | POA: Diagnosis not present

## 2021-11-05 DIAGNOSIS — Z9089 Acquired absence of other organs: Secondary | ICD-10-CM | POA: Diagnosis not present

## 2021-11-05 DIAGNOSIS — N925 Other specified irregular menstruation: Secondary | ICD-10-CM | POA: Diagnosis not present

## 2021-11-13 ENCOUNTER — Ambulatory Visit: Payer: BC Managed Care – PPO | Admitting: Podiatry

## 2021-11-13 DIAGNOSIS — Q666 Other congenital valgus deformities of feet: Secondary | ICD-10-CM

## 2021-11-13 DIAGNOSIS — Q828 Other specified congenital malformations of skin: Secondary | ICD-10-CM

## 2021-11-13 NOTE — Progress Notes (Signed)
  Subjective:  Patient ID: Heather Peters, female    DOB: May 20, 1985,  MRN: [id number]  Chief Complaint  Patient presents with   Callouses    36 y.o. female presents with the above complaint.  Patient presents with complaint of right submetatarsal 5 porokeratotic lesion with underlying pes planovalgus deformity.  Patient states is painful to touch is progressive gotten worse hurts with ambulation.  She was seen long time ago for similar situation to the other side however since then the right side has been bothersome.  She denies doing any other acute treatment she has not tried anything for it.  This hurts with going barefooted as well.  She denies any other acute issues   Review of Systems: Negative except as noted in the HPI. Denies N/V/F/Ch.  Past Medical History:  Diagnosis Date   Allergy    Basal cell carcinoma    Melanoma ([place]) 2015   PONV (postoperative nausea and vomiting)    Thyroid disease     Current Outpatient Medications:    cetirizine (ZYRTEC) 10 MG tablet, Take 10 mg by mouth daily., Disp: , Rfl:    Cholecalciferol (D3-1000 PO), Take by mouth., Disp: , Rfl:    Cyanocobalamin (VITAMIN B-12) 500 MCG SUBL, Place 1 tablet (500 mcg total) under the tongue daily., Disp: 30 tablet, Rfl: 0   meloxicam (MOBIC) 15 MG tablet, Take 1 tablet (15 mg total) by mouth daily. For 2 weeks for pain and inflammation. Then take as needed, Disp: 30 tablet, Rfl: 0   UNABLE TO FIND, Med Name: Allergy Shots once a week injections, Disp: , Rfl:   Social History   Tobacco Use  Smoking Status Never  Smokeless Tobacco Never    No Known Allergies Objective:  There were no vitals filed for this visit. There is no height or weight on file to calculate BMI. Constitutional Well developed. Well nourished.  Vascular Dorsalis pedis pulses palpable bilaterally. Posterior tibial pulses palpable bilaterally. Capillary refill normal to all digits.  No cyanosis or clubbing noted. Pedal hair growth  normal.  Neurologic Normal speech. Oriented to person, place, and time. Epicritic sensation to light touch grossly present bilaterally.  Dermatologic Hyperkeratotic lesion with central nucleated core noted right submetatarsal 5.  Underlying pes planovalgus foot structure noted with calcaneovalgus to many toe signs partially able to recruit the arch with dorsiflexion of the hallux.  Orthopedic: Normal joint ROM without pain or crepitus bilaterally. No visible deformities. No bony tenderness.   Radiographs: None Assessment:   1. Porokeratosis   2. Pes planovalgus    Plan:  Patient was evaluated and treated and all questions answered.  Right submetatarsal 5 porokeratotic lesion with underlying pes planovalgus - All questions and concerns were discussed with the patient in extensive detail given the amount of pain that she is experiencing she will benefit from debridement of the lesion using chisel blade handle.  Lesion was debrided down to healthy dry tissue.  No complication noted no pinpoint bleeding noted. -She will also benefit from orthotics management with offloading of pes planovalgus deformity and offloading of the submetatarsal 5.  She agrees with the plan and would like to proceed with obtaining orthotics. -She was casted for orthotics in standard technique No follow-ups on file.  Right submet 5 porokeratosis.  Semiflexible pes planovalgus orthotics offload submet 5

## 2021-11-18 DIAGNOSIS — L815 Leukoderma, not elsewhere classified: Secondary | ICD-10-CM | POA: Diagnosis not present

## 2021-11-18 DIAGNOSIS — D225 Melanocytic nevi of trunk: Secondary | ICD-10-CM | POA: Diagnosis not present

## 2021-11-18 DIAGNOSIS — D485 Neoplasm of uncertain behavior of skin: Secondary | ICD-10-CM | POA: Diagnosis not present

## 2021-11-25 ENCOUNTER — Telehealth: Payer: Self-pay | Admitting: Podiatry

## 2021-11-25 NOTE — Telephone Encounter (Signed)
LVM FOR [facility]PU

## 2021-11-26 ENCOUNTER — Encounter: Payer: Self-pay | Admitting: Podiatry

## 2021-12-04 ENCOUNTER — Ambulatory Visit (INDEPENDENT_AMBULATORY_CARE_PROVIDER_SITE_OTHER): Payer: BC Managed Care – PPO | Admitting: *Deleted

## 2021-12-04 DIAGNOSIS — Q666 Other congenital valgus deformities of feet: Secondary | ICD-10-CM

## 2021-12-04 NOTE — Progress Notes (Signed)
Patient presents today to pick up custom molded foot orthotics, diagnosed with pes planovalgus by Dr. Posey Pronto.   Orthotics were dispensed and fit was satisfactory. Reviewed instructions for break-in and wear. Written instructions given to patient.  Patient will follow up as needed.   [facility] - order # [id number]

## 2021-12-18 DIAGNOSIS — Z1389 Encounter for screening for other disorder: Secondary | ICD-10-CM | POA: Diagnosis not present

## 2021-12-18 DIAGNOSIS — Z01411 Encounter for gynecological examination (general) (routine) with abnormal findings: Secondary | ICD-10-CM | POA: Diagnosis not present

## 2021-12-18 DIAGNOSIS — N911 Secondary amenorrhea: Secondary | ICD-10-CM | POA: Diagnosis not present

## 2021-12-18 DIAGNOSIS — Z130 Encounter for screening for diseases of the blood and blood-forming organs and certain disorders involving the immune mechanism: Secondary | ICD-10-CM | POA: Diagnosis not present

## 2021-12-19 DIAGNOSIS — N911 Secondary amenorrhea: Secondary | ICD-10-CM | POA: Diagnosis not present

## 2021-12-30 DIAGNOSIS — L821 Other seborrheic keratosis: Secondary | ICD-10-CM | POA: Diagnosis not present

## 2021-12-30 DIAGNOSIS — L910 Hypertrophic scar: Secondary | ICD-10-CM | POA: Diagnosis not present

## 2021-12-30 DIAGNOSIS — L814 Other melanin hyperpigmentation: Secondary | ICD-10-CM | POA: Diagnosis not present

## 2021-12-30 DIAGNOSIS — D225 Melanocytic nevi of trunk: Secondary | ICD-10-CM | POA: Diagnosis not present

## 2021-12-30 DIAGNOSIS — Z08 Encounter for follow-up examination after completed treatment for malignant neoplasm: Secondary | ICD-10-CM | POA: Diagnosis not present

## 2022-01-07 DIAGNOSIS — L659 Nonscarring hair loss, unspecified: Secondary | ICD-10-CM | POA: Diagnosis not present

## 2022-01-07 DIAGNOSIS — Z8639 Personal history of other endocrine, nutritional and metabolic disease: Secondary | ICD-10-CM | POA: Diagnosis not present

## 2022-01-07 DIAGNOSIS — N911 Secondary amenorrhea: Secondary | ICD-10-CM | POA: Diagnosis not present

## 2022-01-07 DIAGNOSIS — E559 Vitamin D deficiency, unspecified: Secondary | ICD-10-CM | POA: Diagnosis not present

## 2022-01-08 DIAGNOSIS — E230 Hypopituitarism: Secondary | ICD-10-CM | POA: Diagnosis not present

## 2022-01-08 DIAGNOSIS — E559 Vitamin D deficiency, unspecified: Secondary | ICD-10-CM | POA: Diagnosis not present

## 2022-01-08 DIAGNOSIS — L659 Nonscarring hair loss, unspecified: Secondary | ICD-10-CM | POA: Diagnosis not present

## 2022-01-08 DIAGNOSIS — N911 Secondary amenorrhea: Secondary | ICD-10-CM | POA: Diagnosis not present

## 2022-01-08 DIAGNOSIS — Z8639 Personal history of other endocrine, nutritional and metabolic disease: Secondary | ICD-10-CM | POA: Diagnosis not present

## 2022-01-14 DIAGNOSIS — N911 Secondary amenorrhea: Secondary | ICD-10-CM | POA: Diagnosis not present

## 2022-01-14 DIAGNOSIS — Z8639 Personal history of other endocrine, nutritional and metabolic disease: Secondary | ICD-10-CM | POA: Diagnosis not present

## 2022-01-14 DIAGNOSIS — L659 Nonscarring hair loss, unspecified: Secondary | ICD-10-CM | POA: Diagnosis not present

## 2022-01-14 DIAGNOSIS — E559 Vitamin D deficiency, unspecified: Secondary | ICD-10-CM | POA: Diagnosis not present

## 2022-01-16 DIAGNOSIS — N911 Secondary amenorrhea: Secondary | ICD-10-CM | POA: Diagnosis not present
